# Patient Record
Sex: Female | Born: 1980 | Race: Black or African American | Hispanic: No | Marital: Single | Smoking: Current every day smoker
Health system: Southern US, Community
[De-identification: ages and names within clinical notes are randomized; demographics above are authoritative.]

## PROBLEM LIST (undated history)

## (undated) DIAGNOSIS — M549 Dorsalgia, unspecified: Secondary | ICD-10-CM

## (undated) HISTORY — PX: NO PAST SURGERIES: SHX2092

---

## 2008-03-12 ENCOUNTER — Emergency Department (HOSPITAL_BASED_OUTPATIENT_CLINIC_OR_DEPARTMENT_OTHER): Admission: EM | Admit: 2008-03-12 | Discharge: 2008-03-12 | Payer: Self-pay | Admitting: Emergency Medicine

## 2008-03-20 ENCOUNTER — Emergency Department (HOSPITAL_BASED_OUTPATIENT_CLINIC_OR_DEPARTMENT_OTHER): Admission: EM | Admit: 2008-03-20 | Discharge: 2008-03-20 | Payer: Self-pay | Admitting: Emergency Medicine

## 2008-07-08 ENCOUNTER — Emergency Department (HOSPITAL_BASED_OUTPATIENT_CLINIC_OR_DEPARTMENT_OTHER): Admission: EM | Admit: 2008-07-08 | Discharge: 2008-07-08 | Payer: Self-pay | Admitting: Emergency Medicine

## 2009-04-30 ENCOUNTER — Emergency Department (HOSPITAL_BASED_OUTPATIENT_CLINIC_OR_DEPARTMENT_OTHER): Admission: EM | Admit: 2009-04-30 | Discharge: 2009-04-30 | Payer: Self-pay | Admitting: Emergency Medicine

## 2009-11-16 ENCOUNTER — Emergency Department (HOSPITAL_BASED_OUTPATIENT_CLINIC_OR_DEPARTMENT_OTHER): Admission: EM | Admit: 2009-11-16 | Discharge: 2009-11-16 | Payer: Self-pay | Admitting: Emergency Medicine

## 2009-12-10 ENCOUNTER — Emergency Department (HOSPITAL_BASED_OUTPATIENT_CLINIC_OR_DEPARTMENT_OTHER): Admission: EM | Admit: 2009-12-10 | Discharge: 2009-12-10 | Payer: Self-pay | Admitting: Emergency Medicine

## 2010-01-08 ENCOUNTER — Emergency Department (HOSPITAL_BASED_OUTPATIENT_CLINIC_OR_DEPARTMENT_OTHER): Admission: EM | Admit: 2010-01-08 | Discharge: 2010-01-08 | Payer: Self-pay | Admitting: Emergency Medicine

## 2010-01-08 ENCOUNTER — Ambulatory Visit: Payer: Self-pay | Admitting: Radiology

## 2010-04-02 ENCOUNTER — Emergency Department (HOSPITAL_BASED_OUTPATIENT_CLINIC_OR_DEPARTMENT_OTHER): Admission: EM | Admit: 2010-04-02 | Discharge: 2010-04-03 | Payer: Self-pay | Admitting: Emergency Medicine

## 2010-06-26 ENCOUNTER — Emergency Department (HOSPITAL_BASED_OUTPATIENT_CLINIC_OR_DEPARTMENT_OTHER): Admission: EM | Admit: 2010-06-26 | Discharge: 2010-06-26 | Payer: Self-pay | Admitting: Emergency Medicine

## 2010-10-31 ENCOUNTER — Emergency Department (HOSPITAL_BASED_OUTPATIENT_CLINIC_OR_DEPARTMENT_OTHER)
Admission: EM | Admit: 2010-10-31 | Discharge: 2010-10-31 | Disposition: A | Discharge status: Home / Self Care | Payer: Medicaid Other | Attending: Emergency Medicine | Admitting: Emergency Medicine

## 2010-10-31 DIAGNOSIS — M545 Low back pain, unspecified: Secondary | ICD-10-CM | POA: Insufficient documentation

## 2010-10-31 DIAGNOSIS — G8929 Other chronic pain: Secondary | ICD-10-CM | POA: Insufficient documentation

## 2010-12-04 ENCOUNTER — Inpatient Hospital Stay (HOSPITAL_COMMUNITY): Payer: Medicaid Other

## 2010-12-04 ENCOUNTER — Inpatient Hospital Stay (HOSPITAL_COMMUNITY)
Admission: AD | Admit: 2010-12-04 | Discharge: 2010-12-04 | Disposition: A | Discharge status: Home / Self Care | Payer: Medicaid Other | Source: Ambulatory Visit | Attending: Obstetrics & Gynecology | Admitting: Obstetrics & Gynecology

## 2010-12-04 DIAGNOSIS — R109 Unspecified abdominal pain: Secondary | ICD-10-CM | POA: Insufficient documentation

## 2010-12-04 DIAGNOSIS — O34599 Maternal care for other abnormalities of gravid uterus, unspecified trimester: Secondary | ICD-10-CM | POA: Insufficient documentation

## 2010-12-04 DIAGNOSIS — A499 Bacterial infection, unspecified: Secondary | ICD-10-CM | POA: Insufficient documentation

## 2010-12-04 DIAGNOSIS — N760 Acute vaginitis: Secondary | ICD-10-CM | POA: Insufficient documentation

## 2010-12-04 DIAGNOSIS — O2390 Unspecified genitourinary tract infection in pregnancy, unspecified trimester: Secondary | ICD-10-CM | POA: Insufficient documentation

## 2010-12-04 DIAGNOSIS — B9689 Other specified bacterial agents as the cause of diseases classified elsewhere: Secondary | ICD-10-CM | POA: Insufficient documentation

## 2010-12-04 DIAGNOSIS — N83209 Unspecified ovarian cyst, unspecified side: Secondary | ICD-10-CM | POA: Insufficient documentation

## 2010-12-04 LAB — CBC
HCT: 39.2 % (ref 36.0–46.0)
Hemoglobin: 13.5 g/dL (ref 12.0–15.0)
MCV: 82 fL (ref 78.0–100.0)
WBC: 6.8 10*3/uL (ref 4.0–10.5)

## 2010-12-04 LAB — URINALYSIS, ROUTINE W REFLEX MICROSCOPIC
Ketones, ur: 15 mg/dL — AB
Nitrite: NEGATIVE
Specific Gravity, Urine: >1.03 — ABNORMAL HIGH (ref 1.005–1.030)
pH: 6 (ref 5.0–8.0)

## 2010-12-04 LAB — WET PREP, GENITAL: Trich, Wet Prep: NONE SEEN

## 2010-12-04 LAB — HCG, QUANTITATIVE, PREGNANCY: hCG, Beta Chain, Quant, S: 1529 m[IU]/mL — ABNORMAL HIGH (ref <5)

## 2010-12-04 LAB — POCT PREGNANCY, URINE: Preg Test, Ur: POSITIVE

## 2011-01-25 LAB — CBC
HCT: 36 % (ref 36–46)
Hemoglobin: 12.3 g/dL (ref 12.0–16.0)

## 2011-01-25 LAB — HIV ANTIBODY (ROUTINE TESTING W REFLEX)
HIV: NONREACTIVE
HIV: NONREACTIVE

## 2011-01-25 LAB — RUBELLA ANTIBODY, IGM: Rubella: IMMUNE

## 2011-01-25 LAB — SICKLE CELL SCREEN: Sickle Cell Screen: NEGATIVE

## 2011-01-25 LAB — HEPATITIS B SURFACE ANTIGEN: Hepatitis B Surface Ag: NEGATIVE

## 2011-05-18 LAB — CBC: Hemoglobin: 11.2 g/dL — AB (ref 12.0–16.0)

## 2011-05-31 LAB — URINALYSIS, ROUTINE W REFLEX MICROSCOPIC
Bilirubin Urine: NEGATIVE
Glucose, UA: NEGATIVE
Hgb urine dipstick: NEGATIVE
Specific Gravity, Urine: 1.027
Urobilinogen, UA: 0.2
pH: 6

## 2011-05-31 LAB — PREGNANCY, URINE: Preg Test, Ur: NEGATIVE

## 2011-07-09 LAB — STREP B DNA PROBE: GBS: POSITIVE

## 2011-07-21 ENCOUNTER — Encounter (HOSPITAL_COMMUNITY): Payer: Self-pay | Admitting: *Deleted

## 2011-07-21 ENCOUNTER — Inpatient Hospital Stay (HOSPITAL_COMMUNITY)
Admission: AD | Admit: 2011-07-21 | Discharge: 2011-07-21 | Disposition: A | Discharge status: Home / Self Care | Payer: Medicaid Other | Source: Ambulatory Visit | Attending: Obstetrics and Gynecology | Admitting: Obstetrics and Gynecology

## 2011-07-21 DIAGNOSIS — O471 False labor at or after 37 completed weeks of gestation: Secondary | ICD-10-CM

## 2011-07-21 DIAGNOSIS — O479 False labor, unspecified: Secondary | ICD-10-CM | POA: Insufficient documentation

## 2011-07-21 MED ORDER — OXYCODONE-ACETAMINOPHEN 5-325 MG PO TABS
2.0000 | ORAL_TABLET | Freq: Once | ORAL | Status: AC
  Administered 2011-07-21: 2 via ORAL
  Filled 2011-07-21 (×2): qty 2

## 2011-07-21 MED ORDER — ZOLPIDEM TARTRATE 10 MG PO TABS
10.0000 mg | ORAL_TABLET | Freq: Once | ORAL | Status: AC
  Administered 2011-07-21: 10 mg via ORAL
  Filled 2011-07-21: qty 1

## 2011-07-21 NOTE — ED Provider Notes (Signed)
History     Chief Complaint  Patient presents with  . Contractions   HPI Returned from walking.  Reports contractions have increased in intensity but doesn't think they're any closer.  Denies ROM or bldg.  Has felt baby move.    ROS Unchanged Physical Exam   Blood pressure 139/70, pulse 84, temperature 98 F (36.7 C), temperature source Oral, resp. rate 20, height 5\' 5"  (1.651 m), weight 100.699 kg (222 lb), last menstrual period 10/30/2010.  Physical Exam SVE: Dilation 3cm Effacement: 80% Station: -2 Posterior/soft Vertex  FHR baseline 135 bpm Moderate variability Accels present.  No decels present.  FHR Cat 1 UCs every 6-7 mins and mod to palpation.  MAU Course  Procedures   Assessment and Plan  IUP at 37w 4d Uterine contractions.  Will continue to observe for another hour and re-eval SVE due to pt discomfort with contractions.    Kassondra Geil O. 07/21/2011, 6:14 PM   S:  Pt reports no significant change in contractions at present  O:  VSS.       FHR baseline 125 with moderate variability present.  Accels present.  No decels present.       UCs every 6-9 minutes       SVE per RN unchanged  A:  IUP at 37w 4d      False labor  P:  Discharge home.      Per consult with Dr. Normand Sloop, will give Ambien 10mg  and Percocet 5/325mg  x 2 tabs for therapeutic rest prior to discharge.      Rev'd s/s labor.      RTO at [facility] as previously scheduled for ROB.

## 2011-07-21 NOTE — Progress Notes (Signed)
Pt states she is feeling about the same.Pt introduced to me as her new nurse.

## 2011-07-21 NOTE — ED Provider Notes (Signed)
History     Chief Complaint  Patient presents with  . Contractions   HPI Pt presents with complaint of regular uterine contractions which began today at approximately 11:00 am and have increased in frequency and intensity.  She denies ROM or bldg.  She reports her fetus has been moving normally.  Pt GBS positive.  OB History    Grav Para Term Preterm Abortions TAB SAB Ect Mult Living   5 3 2 1 1 1    2       Past Medical History  Diagnosis Date  . Depression     Past Surgical History  Procedure Date  . No past surgeries     Family History  Problem Relation Age of Onset  . Hypertension Mother   . COPD Mother   . Hypertension Maternal Grandmother   . Cancer Maternal Grandmother     History  Substance Use Topics  . Smoking status: Current Everyday Smoker -- 0.5 packs/day    Types: Cigarettes  . Smokeless tobacco: Never Used   Comment: 1.5 ppd prior to pregnancy  . Alcohol Use: No    Allergies: No Known Allergies  No prescriptions prior to admission    Review of Systems  Constitutional: Negative.   HENT: Negative.   Eyes: Negative.   Respiratory: Negative.   Cardiovascular: Negative.   Gastrointestinal: Negative.   Genitourinary: Negative.   Musculoskeletal: Negative.   Skin: Negative.   Neurological: Negative.   Endo/Heme/Allergies: Negative.   Psychiatric/Behavioral: Negative.    Physical Exam   Blood pressure 139/70, pulse 84, temperature 98 F (36.7 C), temperature source Oral, resp. rate 20, height 5\' 5"  (1.651 m), weight 100.699 kg (222 lb), last menstrual period 10/30/2010.  Physical Exam  Constitutional: She is oriented to person, place, and time. She appears well-developed and well-nourished.  HENT:  Head: Normocephalic and atraumatic.  Right Ear: External ear normal.  Left Ear: External ear normal.  Nose: Nose normal.  Eyes: Conjunctivae are normal. Pupils are equal, round, and reactive to light.  Neck: Normal range of motion. Neck  supple. No thyromegaly present.  Cardiovascular: Normal rate, regular rhythm, normal heart sounds and intact distal pulses.   Respiratory: Effort normal and breath sounds normal.  GI: Soft. Bowel sounds are normal.  Genitourinary: Vagina normal and uterus normal.       Fundal height 38cms.  Vertex to Leopolds maneuver.  EFW 7#.    Musculoskeletal: Normal range of motion.  Neurological: She is alert and oriented to person, place, and time. She has normal reflexes.  Skin: Skin is warm and dry.  Psychiatric: She has a normal mood and affect. Her behavior is normal. Judgment and thought content normal.   FHR baseline 125 bpm.  Moderate variability present.  Accels present.  No decels present. UC's every 2-3 minutes and mod to palpation  SVE:  Dilation 3cm Effacement:  50% Station:  -2 Presentation:  Vtx Soft/posterior  MAU Course  Procedures   Assessment and Plan  IUP at 37w 4d Uterine contractions  Pt to ambulate x 1hr and then re-eval SVE.    Shilo Philipson O. 07/21/2011, 5:11 PM

## 2011-07-23 ENCOUNTER — Inpatient Hospital Stay (HOSPITAL_COMMUNITY): Payer: Medicaid Other | Admitting: Anesthesiology

## 2011-07-23 ENCOUNTER — Encounter (HOSPITAL_COMMUNITY): Payer: Self-pay | Admitting: Anesthesiology

## 2011-07-23 ENCOUNTER — Inpatient Hospital Stay (HOSPITAL_COMMUNITY)
Admission: AD | Admit: 2011-07-23 | Discharge: 2011-07-24 | DRG: 775 | Disposition: A | Discharge status: Home / Self Care | Payer: Medicaid Other | Source: Ambulatory Visit | Attending: Obstetrics and Gynecology | Admitting: Obstetrics and Gynecology

## 2011-07-23 ENCOUNTER — Encounter (HOSPITAL_COMMUNITY): Payer: Self-pay

## 2011-07-23 DIAGNOSIS — O4290 Premature rupture of membranes, unspecified as to length of time between rupture and onset of labor, unspecified weeks of gestation: Principal | ICD-10-CM | POA: Diagnosis present

## 2011-07-23 DIAGNOSIS — Z8619 Personal history of other infectious and parasitic diseases: Secondary | ICD-10-CM | POA: Diagnosis not present

## 2011-07-23 DIAGNOSIS — O99892 Other specified diseases and conditions complicating childbirth: Secondary | ICD-10-CM | POA: Diagnosis present

## 2011-07-23 DIAGNOSIS — Z8659 Personal history of other mental and behavioral disorders: Secondary | ICD-10-CM

## 2011-07-23 DIAGNOSIS — O26851 Spotting complicating pregnancy, first trimester: Secondary | ICD-10-CM | POA: Diagnosis not present

## 2011-07-23 DIAGNOSIS — Z22330 Carrier of Group B streptococcus: Secondary | ICD-10-CM

## 2011-07-23 DIAGNOSIS — F17200 Nicotine dependence, unspecified, uncomplicated: Secondary | ICD-10-CM | POA: Diagnosis present

## 2011-07-23 LAB — CBC
Platelets: 234 10*3/uL (ref 150–400)
RBC: 3.8 MIL/uL — ABNORMAL LOW (ref 3.87–5.11)
WBC: 11.7 10*3/uL — ABNORMAL HIGH (ref 4.0–10.5)

## 2011-07-23 LAB — RPR: RPR Ser Ql: NONREACTIVE

## 2011-07-23 MED ORDER — EPHEDRINE 5 MG/ML INJ: 10.0000 mg | INTRAVENOUS | Status: DC | PRN

## 2011-07-23 MED ORDER — OXYTOCIN 10 UNIT/ML IJ SOLN: 10.0000 [IU] | Freq: Once | INTRAMUSCULAR | Status: DC

## 2011-07-23 MED ORDER — TETANUS-DIPHTH-ACELL PERTUSSIS 5-2.5-18.5 LF-MCG/0.5 IM SUSP
0.5000 mL | Freq: Once | INTRAMUSCULAR | Status: AC
  Administered 2011-07-24: 0.5 mL via INTRAMUSCULAR
  Filled 2011-07-23: qty 0.5

## 2011-07-23 MED ORDER — LANOLIN HYDROUS EX OINT: TOPICAL_OINTMENT | CUTANEOUS | Status: DC | PRN

## 2011-07-23 MED ORDER — TERBUTALINE SULFATE 1 MG/ML IJ SOLN: 0.2500 mg | Freq: Once | INTRAMUSCULAR | Status: DC | PRN

## 2011-07-23 MED ORDER — MEDROXYPROGESTERONE ACETATE 150 MG/ML IM SUSP: 150.0000 mg | INTRAMUSCULAR | Status: DC | PRN

## 2011-07-23 MED ORDER — ONDANSETRON HCL 4 MG/2ML IJ SOLN: 4.0000 mg | Freq: Four times a day (QID) | INTRAMUSCULAR | Status: DC | PRN

## 2011-07-23 MED ORDER — OXYTOCIN 20 UNITS IN LACTATED RINGERS INFUSION - SIMPLE: 125.0000 mL/h | Freq: Once | INTRAVENOUS | Status: DC

## 2011-07-23 MED ORDER — DIPHENHYDRAMINE HCL 25 MG PO CAPS: 25.0000 mg | ORAL_CAPSULE | Freq: Four times a day (QID) | ORAL | Status: DC | PRN

## 2011-07-23 MED ORDER — OXYTOCIN 20 UNITS IN LACTATED RINGERS INFUSION - SIMPLE
1.0000 m[IU]/min | INTRAVENOUS | Status: DC
  Administered 2011-07-23: 1 m[IU]/min via INTRAVENOUS
  Filled 2011-07-23: qty 1000

## 2011-07-23 MED ORDER — SENNOSIDES-DOCUSATE SODIUM 8.6-50 MG PO TABS
2.0000 | ORAL_TABLET | Freq: Every day | ORAL | Status: DC
  Administered 2011-07-23: 2 via ORAL

## 2011-07-23 MED ORDER — LIDOCAINE HCL (PF) 1 % IJ SOLN
30.0000 mL | INTRAMUSCULAR | Status: DC | PRN
  Filled 2011-07-23: qty 30

## 2011-07-23 MED ORDER — PHENYLEPHRINE 40 MCG/ML (10ML) SYRINGE FOR IV PUSH (FOR BLOOD PRESSURE SUPPORT): 80.0000 ug | PREFILLED_SYRINGE | INTRAVENOUS | Status: DC | PRN

## 2011-07-23 MED ORDER — LACTATED RINGERS IV SOLN: 500.0000 mL | INTRAVENOUS | Status: DC | PRN

## 2011-07-23 MED ORDER — PENICILLIN G POTASSIUM 5000000 UNITS IJ SOLR
2.5000 10*6.[IU] | INTRAVENOUS | Status: DC
  Administered 2011-07-23: 2.5 10*6.[IU] via INTRAVENOUS
  Filled 2011-07-23 (×3): qty 2.5

## 2011-07-23 MED ORDER — IBUPROFEN 600 MG PO TABS
600.0000 mg | ORAL_TABLET | Freq: Four times a day (QID) | ORAL | Status: DC
  Administered 2011-07-23 – 2011-07-24 (×5): 600 mg via ORAL
  Filled 2011-07-23 (×4): qty 1

## 2011-07-23 MED ORDER — FENTANYL 2.5 MCG/ML BUPIVACAINE 1/10 % EPIDURAL INFUSION (WH - ANES)
14.0000 mL/h | INTRAMUSCULAR | Status: DC
  Administered 2011-07-23 (×2): 14 mL/h via EPIDURAL
  Filled 2011-07-23 (×2): qty 60

## 2011-07-23 MED ORDER — ACETAMINOPHEN 325 MG PO TABS: 650.0000 mg | ORAL_TABLET | ORAL | Status: DC | PRN

## 2011-07-23 MED ORDER — EPHEDRINE 5 MG/ML INJ
10.0000 mg | INTRAVENOUS | Status: DC | PRN
  Filled 2011-07-23: qty 4

## 2011-07-23 MED ORDER — OXYCODONE-ACETAMINOPHEN 5-325 MG PO TABS
1.0000 | ORAL_TABLET | ORAL | Status: DC | PRN
  Administered 2011-07-23 (×2): 2 via ORAL
  Administered 2011-07-24 (×3): 1 via ORAL
  Filled 2011-07-23 (×2): qty 1
  Filled 2011-07-23: qty 2
  Filled 2011-07-23: qty 1
  Filled 2011-07-23: qty 2

## 2011-07-23 MED ORDER — PRENATAL PLUS 27-1 MG PO TABS
1.0000 | ORAL_TABLET | Freq: Every day | ORAL | Status: DC
  Administered 2011-07-23 – 2011-07-24 (×2): 1 via ORAL
  Filled 2011-07-23: qty 1

## 2011-07-23 MED ORDER — SIMETHICONE 80 MG PO CHEW: 80.0000 mg | CHEWABLE_TABLET | ORAL | Status: DC | PRN

## 2011-07-23 MED ORDER — WITCH HAZEL-GLYCERIN EX PADS: 1.0000 "application " | MEDICATED_PAD | CUTANEOUS | Status: DC | PRN

## 2011-07-23 MED ORDER — IBUPROFEN 600 MG PO TABS
600.0000 mg | ORAL_TABLET | Freq: Four times a day (QID) | ORAL | Status: DC | PRN
  Administered 2011-07-23: 600 mg via ORAL
  Filled 2011-07-23 (×2): qty 1

## 2011-07-23 MED ORDER — OXYTOCIN BOLUS FROM INFUSION
500.0000 mL | Freq: Once | INTRAVENOUS | Status: DC
  Filled 2011-07-23: qty 500

## 2011-07-23 MED ORDER — CITRIC ACID-SODIUM CITRATE 334-500 MG/5ML PO SOLN
30.0000 mL | ORAL | Status: DC | PRN
  Administered 2011-07-23: 30 mL via ORAL
  Filled 2011-07-23: qty 15

## 2011-07-23 MED ORDER — DIBUCAINE 1 % RE OINT: 1.0000 "application " | TOPICAL_OINTMENT | RECTAL | Status: DC | PRN

## 2011-07-23 MED ORDER — LACTATED RINGERS IV SOLN: 500.0000 mL | Freq: Once | INTRAVENOUS | Status: DC

## 2011-07-23 MED ORDER — ZOLPIDEM TARTRATE 5 MG PO TABS: 5.0000 mg | ORAL_TABLET | Freq: Every evening | ORAL | Status: DC | PRN

## 2011-07-23 MED ORDER — ONDANSETRON HCL 4 MG/2ML IJ SOLN: 4.0000 mg | INTRAMUSCULAR | Status: DC | PRN

## 2011-07-23 MED ORDER — ONDANSETRON HCL 4 MG PO TABS: 4.0000 mg | ORAL_TABLET | ORAL | Status: DC | PRN

## 2011-07-23 MED ORDER — MAGNESIUM HYDROXIDE 400 MG/5ML PO SUSP: 30.0000 mL | ORAL | Status: DC | PRN

## 2011-07-23 MED ORDER — BENZOCAINE-MENTHOL 20-0.5 % EX AERO: 1.0000 "application " | INHALATION_SPRAY | CUTANEOUS | Status: DC | PRN

## 2011-07-23 MED ORDER — OXYCODONE-ACETAMINOPHEN 5-325 MG PO TABS: 2.0000 | ORAL_TABLET | ORAL | Status: DC | PRN

## 2011-07-23 MED ORDER — DIPHENHYDRAMINE HCL 50 MG/ML IJ SOLN: 12.5000 mg | INTRAMUSCULAR | Status: DC | PRN

## 2011-07-23 MED ORDER — DEXTROSE 5 % IV SOLN
5.0000 10*6.[IU] | Freq: Once | INTRAVENOUS | Status: AC
  Administered 2011-07-23: 5 10*6.[IU] via INTRAVENOUS
  Filled 2011-07-23: qty 5

## 2011-07-23 MED ORDER — PHENYLEPHRINE 40 MCG/ML (10ML) SYRINGE FOR IV PUSH (FOR BLOOD PRESSURE SUPPORT)
80.0000 ug | PREFILLED_SYRINGE | INTRAVENOUS | Status: DC | PRN
  Filled 2011-07-23: qty 5

## 2011-07-23 MED ORDER — LIDOCAINE HCL 1.5 % IJ SOLN
INTRAMUSCULAR | Status: DC | PRN
  Administered 2011-07-23 (×2): 5 mL via EPIDURAL

## 2011-07-23 MED ORDER — LACTATED RINGERS IV SOLN
INTRAVENOUS | Status: DC
Start: 2011-07-23 — End: 2011-07-23
  Administered 2011-07-23: 04:00:00 via INTRAVENOUS

## 2011-07-23 NOTE — Progress Notes (Signed)
Subjective: Pt received epidural around 0830, but still having discomfort and breathing/grimacing w/ ctxs; pushed PCA at 0910, and will continue to assess response.  Per pt, RN rechecked cervix and 4cm.  Pt is on Pitocin and has received 2 doses of PCN.  S.o., daughter, and another female visitor at [facility].  Objective: BP 124/67  Pulse 87  Temp(Src) 97.7 F (36.5 C) (Oral)  Resp 20  Ht 5' 5.5" (1.664 m)  Wt 101.061 kg (222 lb 12.8 oz)  BMI 36.51 kg/m2  SpO2 99%  LMP 10/30/2010      FHT:  FHR: 125 bpm, variability: moderate,  accelerations:  Present,  decelerations:  Absent UC:   toco not tracing well at this time; pt had one ctx during the 5 min I was at [facility] SVE:   Dilation: 4 Effacement (%): 90 Station: -1 Exam by:: [email or web address]  Labs: Lab Results  Component Value Date   WBC 11.7* 07/23/2011   HGB 10.8* 07/23/2011   HCT 32.4* 07/23/2011   MCV 85.3 07/23/2011   PLT 234 07/23/2011    Assessment / Plan: Augmentation of labor, progressing well  Labor: Active labor on Pitocin Preeclampsia:  no signs or symptoms of toxicity Fetal Wellbeing:  Category I Pain Control:  Epidural I/D:  n/a Anticipated MOD:  NSVD  Rick Carruthers H 07/23/2011, 9:15 AM

## 2011-07-23 NOTE — Progress Notes (Signed)
Pt G% P2 at 37.6wks, leaking clear fluid since 0200.  Denies pain or bleeding at this time.

## 2011-07-23 NOTE — Anesthesia Postprocedure Evaluation (Signed)
  Anesthesia Post-op Note  Patient: Annette Stafford  Procedure(s) Performed: * No procedures listed *  Patient Location: Mother/Baby  Anesthesia Type: Epidural  Level of Consciousness: awake, alert  and oriented  Airway and Oxygen Therapy: Patient Spontanous Breathing  Post-op Pain: none  Post-op Assessment: Post-op Vital signs reviewed, Patient's Cardiovascular Status Stable, No headache and No backache  Post-op Vital Signs: Reviewed and stable  Complications: No apparent anesthesia complications

## 2011-07-23 NOTE — Anesthesia Preprocedure Evaluation (Signed)
Anesthesia Evaluation  Patient identified by MRN, date of birth, ID band Patient awake    Reviewed: Allergy & Precautions, H&P , Patient's Chart, lab work & pertinent test results  Airway Mallampati: III TM Distance: >3 FB Neck ROM: full    Dental No notable dental hx.    Pulmonary neg pulmonary ROS,  clear to auscultation  Pulmonary exam normal       Cardiovascular neg cardio ROS regular Normal    Neuro/Psych Negative Neurological ROS  Negative Psych ROS   GI/Hepatic negative GI ROS, Neg liver ROS,   Endo/Other  Negative Endocrine ROSMorbid obesity  Renal/GU negative Renal ROS     Musculoskeletal   Abdominal   Peds  Hematology negative hematology ROS (+)   Anesthesia Other Findings   Reproductive/Obstetrics (+) Pregnancy                           Anesthesia Physical Anesthesia Plan  ASA: III  Anesthesia Plan: Epidural   Post-op Pain Management:    Induction:   Airway Management Planned:   Additional Equipment:   Intra-op Plan:   Post-operative Plan:   Informed Consent: I have reviewed the patients History and Physical, chart, labs and discussed the procedure including the risks, benefits and alternatives for the proposed anesthesia with the patient or authorized representative who has indicated his/her understanding and acceptance.     Plan Discussed with:   Anesthesia Plan Comments:         Anesthesia Quick Evaluation  

## 2011-07-23 NOTE — Progress Notes (Signed)
Delivery Note Pt complete around 1030.  Pushed well to SVD at 10:51 AM a viable female "Annette Stafford" was delivered via Vaginal, Spontaneous Delivery (Presentation: Right Occiput Anterior).  APGAR: 9, 9; weight 6 lb 1.2 oz (2756 g).   Placenta status: Intact, Spontaneous.  Cord: 3 vessels with the following complications: Short.  Cord pH: n/a. Cord blood collected for hospital labs and donation.  Fundus firm after placenta delivered below umbilicus.  Anesthesia: Epidural  Episiotomy: None Lacerations: None Suture Repair: n/a Est. Blood Loss (mL): .Marland Kitchen Filed Vitals:   07/23/11 1202 07/23/11 1230 07/23/11 1329 07/23/11 1700  BP: 109/59 110/70 107/68 112/78  Pulse: 82 88 81 98  Temp:  97.9 F (36.6 C) 98.2 F (36.8 C) 98.4 F (36.9 C)  TempSrc:  Oral Oral Oral  Resp:  18 18 18   Height:      Weight:      SpO2:  99% 99% 99%   Mom to postpartum.  Baby to nursery-stable.  All bonding well.  Declines BTL.  Jing Howatt H 07/23/2011, 5:22 PM

## 2011-07-23 NOTE — Anesthesia Procedure Notes (Signed)
Epidural Patient location during procedure: OB Start time: 07/23/2011 8:26 AM  Staffing Performed by: anesthesiologist   Preanesthetic Checklist Completed: patient identified, site marked, surgical consent, pre-op evaluation, timeout performed, IV checked, risks and benefits discussed and monitors and equipment checked  Epidural Patient position: sitting Prep: site prepped and draped and DuraPrep Patient monitoring: continuous pulse ox and blood pressure Approach: midline Injection technique: LOR air and LOR saline  Needle:  Needle type: Tuohy  Needle gauge: 17 G Needle length: 9 cm Needle insertion depth: 8 cm Catheter type: closed end flexible Catheter size: 19 Gauge Catheter at skin depth: 13 cm Test dose: negative  Assessment Events: blood not aspirated, injection not painful, no injection resistance, negative IV test and no paresthesia  Additional Notes Patient identified.  Risk benefits discussed including failed block, incomplete pain control, headache, nerve damage, paralysis, blood pressure changes, nausea, vomiting, reactions to medication both toxic or allergic, and postpartum back pain.  Patient expressed understanding and wished to proceed.  All questions were answered.  Sterile technique used throughout procedure and epidural site dressed with sterile barrier dressing. No paresthesia or other complications noted.The patient did not experience any signs of intravascular injection such as tinnitus or metallic taste in mouth nor signs of intrathecal spread such as rapid motor block. Please see nursing notes for vital signs.

## 2011-07-23 NOTE — H&P (Signed)
Annette Stafford is a 30 y.o. female presenting for SROM at 0200 with clear fluid, reports rare ctx, no pain, +FM, no VB.  Pregnancy significant for: 1. +GBS 2. Hx 22wk loss 3. Hx PIH - no meds 4. Smoker 5. abnl pap - LGSIL, +HPV  Maternal Medical History:  Reason for admission: Reason for admission: rupture of membranes.  Contractions: Frequency: rare.    Fetal activity: Perceived fetal activity is normal.   Last perceived fetal movement was within the past hour.    Prenatal complications: no prenatal complications   OB History    Grav Para Term Preterm Abortions TAB SAB Ect Mult Living   5 3 2 1 1 1    2      Past Medical History  Diagnosis Date  . Depression    Past Surgical History  Procedure Date  . No past surgeries    Family History: family history includes COPD in her mother; Cancer in her maternal grandmother; and Hypertension in her maternal grandmother and mother. Social History:  reports that she has been smoking Cigarettes.  She has been smoking about .5 packs per day. She has never used smokeless tobacco. She reports that she does not drink alcohol or use illicit drugs. Pt is Single AA, FOB involved, has 64yrs of education, currently a student  Review of Systems  All other systems reviewed and are negative.    Dilation: 3 Effacement (%): 80 Station: -2 Exam by:: note previous exam by S.Shannell Mikkelsen,CNM Blood pressure 137/71, pulse 101, temperature 98.1 F (36.7 C), temperature source Oral, resp. rate 18, height 5' 5.5" (1.664 m), weight 101.061 kg (222 lb 12.8 oz), last menstrual period 10/30/2010. Maternal Exam:  Uterine Assessment: Contraction strength is mild.  Contraction frequency is irregular.   Abdomen: Patient reports no abdominal tenderness. Estimated fetal weight is 7#.   Fetal presentation: vertex  Introitus: Normal vulva. Normal vagina.  Ferning test: not done.  Amniotic fluid character: clear. Grossly ruptured clear fluid  Pelvis: adequate for  delivery.   Cervix: Cervix evaluated by digital exam.     Fetal Exam Fetal Monitor Review: Mode: ultrasound.   Baseline rate: 145.  Variability: moderate (6-25 bpm).   Pattern: accelerations present and no decelerations.    Fetal State Assessment: Category I - tracings are normal.     Physical Exam  Nursing note and vitals reviewed. Constitutional: She is oriented to person, place, and time. She appears well-developed and well-nourished.  Cardiovascular: Normal rate.   Respiratory: Effort normal.  GI: Soft.  Genitourinary: Vagina normal.  Musculoskeletal: Normal range of motion.  Neurological: She is alert and oriented to person, place, and time.  Skin: Skin is warm and dry.  Psychiatric: She has a normal mood and affect. Her behavior is normal.    Prenatal labs: ABO, Rh: --/--/O POS (04/02 1535) Antibody: Negative (05/24 0000) Rubella: Immune (05/24 0000) RPR: Nonreactive (09/14 0000)  HBsAg: Negative (05/24 0000)  HIV: Non-reactive (05/24 0000)  GBS: Positive (11/05 0000)   Assessment/Plan: IUP at [redacted]w[redacted]d PROM +GBS  Admit to birthing suites, routine CNM orders PCN per protocol Pitocin augmentation Epidural PRN    Shandie Bertz M 07/23/2011, 3:48 AM

## 2011-07-24 LAB — CBC
Hemoglobin: 8.9 g/dL — ABNORMAL LOW (ref 12.0–15.0)
MCH: 28 pg (ref 26.0–34.0)
MCHC: 32.5 g/dL (ref 30.0–36.0)
Platelets: 211 10*3/uL (ref 150–400)
RDW: 14 % (ref 11.5–15.5)

## 2011-07-24 MED ORDER — IBUPROFEN 600 MG PO TABS: 600.0000 mg | ORAL_TABLET | Freq: Four times a day (QID) | ORAL | Status: AC | PRN

## 2011-07-24 MED ORDER — OXYCODONE-ACETAMINOPHEN 5-325 MG PO TABS: 1.0000 | ORAL_TABLET | ORAL | Status: AC | PRN

## 2011-07-24 MED ORDER — BENZOCAINE-MENTHOL 20-0.5 % EX AERO
INHALATION_SPRAY | CUTANEOUS | Status: AC
  Filled 2011-07-24: qty 56

## 2011-07-24 MED ORDER — POLYSACCHARIDE IRON 150 MG PO CAPS
150.0000 mg | ORAL_CAPSULE | Freq: Two times a day (BID) | ORAL | Status: DC
  Administered 2011-07-24: 150 mg via ORAL
  Filled 2011-07-24 (×2): qty 1

## 2011-07-24 NOTE — Progress Notes (Signed)
UR Chart review completed.  

## 2011-07-24 NOTE — Discharge Summary (Signed)
Obstetric Discharge Summary Reason for Admission: rupture of membranes Prenatal Procedures: ultrasound Intrapartum Procedures: spontaneous vaginal delivery, GBS prophylaxis and Pitocin augmentation, epidural Postpartum Procedures: none Complications-Operative and Postpartum: none Hemoglobin  Date Value Range Status  07/24/2011 8.9* 12.0-15.0 (g/dL) Final     DELTA CHECK NOTED     REPEATED TO VERIFY     HCT  Date Value Range Status  07/24/2011 27.4* 36.0-46.0 (%) Final    Discharge Diagnoses: Term Pregnancy-delivered and Formula feeding; anemia w/o s/s; smoker; h/o HR HPV and abnl pap; remote h/o depression--no s/s currently;   Discharge Information: Date: 07/24/2011 Activity: pelvic rest Diet: routine and iron-rich Medications: PNV, Ibuprofen, Colace, Iron and Percocet Condition: stable Instructions: refer to practice specific booklet Discharge to: home Follow-up Information    Follow up with [facility]. Make an appointment in 6 weeks. (or follow-up  as needed)          Newborn Data: Live born female "Jamie" (delivery provider=Hilary Secily Walthour, CNM) Birth Weight: 6 lb 1.2 oz (2756 g) APGAR: 9, 9  Home with mother.  Anistyn Graddy H 07/24/2011, 7:55 PM

## 2011-07-24 NOTE — Progress Notes (Signed)
Post Partum Day 1 Subjective: no complaints, up ad lib, voiding, tolerating PO, + flatus and no dizziness.  Reports newborn w/ little formula intake since 9am, but only 1 oz wt loss since birth.  Has had several wet diapers, but only 1 stooled diaper.  Interested in early d/c home today. VB lightening.  Objective: Blood pressure 113/82, pulse 82, temperature 97.7 F (36.5 C), temperature source Oral, resp. rate 18, height 5' 5.5" (1.664 m), weight 101.061 kg (222 lb 12.8 oz), last menstrual period 10/30/2010, SpO2 99.00%, unknown if currently breastfeeding.  Physical Exam:  General: alert, cooperative and no distress Lochia: appropriate Uterine Fundus: below umbilicus Incision: n/a DVT Evaluation: No evidence of DVT seen on physical exam. Negative Homan's sign. No significant calf/ankle edema.   Basename 07/24/11 0528 07/23/11 0413  HGB 8.9* 10.8*  HCT 27.4* 32.4*    Assessment/Plan: PPD #1, s/p SVD; further drop in Hgb, but asymptomatic.  Formula feeding.   Pt agreeable to see how newborn does w/ feeds and output over next few hours, & if peds ok w/ d/c later this evening, will d/c pt then with contingency.   Will start po iron supplement and do orthostatics.   LOS: 1 day   Raeleigh Guinn H 07/24/2011, 1:22 PM

## 2011-07-24 NOTE — Progress Notes (Signed)
Patient was referred for history of depression/anxiety.  * Referral screened out by Clinical Social Worker because none of the following criteria appear to apply:  ~ History of anxiety/depression during this pregnancy, or of post-partum depression.  ~ Diagnosis of anxiety and/or depression within last 3 years  ~ History of depression due to pregnancy loss/loss of child  OR  * Patient's symptoms currently being treated with medication and/or therapy.  Please contact the Clinical Social Worker if needs arise, or by the patient's request.  Pt denies any depression symptoms in over 6 years.

## 2011-07-24 NOTE — Progress Notes (Signed)
S: Pt requesting early discharge.  Peds has given order to d/c newborn and they have follow-up for her tomorrow for weight check.  She has fed well this afternoon and good output.  Pt is undecided on [facility] and although she is not older than 36, she is a smoker; she previously had IUD that migrated, although I do not know complete history of this or details.  Do dizziness or syncope this afternoon.  VB has continued to taper.  Orthostatic VS were WNL.  Pt has necessitated percocet for pain in addition to motrin for lower abdominal cramping, and requests Rx at d/c tonight. Pt reports large BM today. O:  .Marland Kitchen Filed Vitals:   07/24/11 1440 07/24/11 1445 07/24/11 1450 07/24/11 1800  BP: 112/70 110/75 115/77 118/63  Pulse: 80 77 76 90  Temp: 97.2 F (36.2 C)   98.1 F (36.7 C)  TempSrc: Oral   Oral  Resp: 18 18 18 18   Height:      Weight:      SpO2:    96%  PE remain unchanged from AM rounds and WNL  A:  1.  PPD#1  2.  Desires early d/c home today  3.  Anemia w/o hemodynamic instability  4. Formula-feeding 5. Remote h/o depression w/o s/s and had SW consult  P:  1.  D/c home per c/w dr. Normand Sloop  2.  Instructions per [facility] pamphlet   3.  Rx for Integra Plus (rec'd Floridex however), Motrin, and Percocet, & rec'd Colace.  4.  Rec'd abstinence until contraception decided on, but after discussion of options, pt interested in Nexplanon  5.  Breast care rev'd  6.  F/u 6 weeks at [facility] or prn

## 2011-10-01 ENCOUNTER — Emergency Department (HOSPITAL_BASED_OUTPATIENT_CLINIC_OR_DEPARTMENT_OTHER)
Admission: EM | Admit: 2011-10-01 | Discharge: 2011-10-01 | Disposition: A | Discharge status: Home / Self Care | Payer: Medicaid Other | Attending: Emergency Medicine | Admitting: Emergency Medicine

## 2011-10-01 ENCOUNTER — Encounter (HOSPITAL_BASED_OUTPATIENT_CLINIC_OR_DEPARTMENT_OTHER): Payer: Self-pay

## 2011-10-01 DIAGNOSIS — F17200 Nicotine dependence, unspecified, uncomplicated: Secondary | ICD-10-CM | POA: Insufficient documentation

## 2011-10-01 DIAGNOSIS — M549 Dorsalgia, unspecified: Secondary | ICD-10-CM | POA: Insufficient documentation

## 2011-10-01 DIAGNOSIS — R209 Unspecified disturbances of skin sensation: Secondary | ICD-10-CM | POA: Insufficient documentation

## 2011-10-01 HISTORY — DX: Dorsalgia, unspecified: M54.9

## 2011-10-01 MED ORDER — METHOCARBAMOL 500 MG PO TABS: 500.0000 mg | ORAL_TABLET | Freq: Two times a day (BID) | ORAL | Status: DC

## 2011-10-01 MED ORDER — METHOCARBAMOL 500 MG PO TABS: 500.0000 mg | ORAL_TABLET | Freq: Two times a day (BID) | ORAL | Status: AC

## 2011-10-01 NOTE — ED Notes (Signed)
Hx of chronic back pain-fell 2 days ago-c/o pain to lower back

## 2011-10-01 NOTE — ED Provider Notes (Signed)
History     CSN: [id number]  Arrival date & time 10/01/11  1119   First MD Initiated Contact with Patient 10/01/11 1208      Chief Complaint  Patient presents with  . Back Pain    (Consider location/radiation/quality/duration/timing/severity/associated sxs/prior treatment) Patient is a 31 y.o. female presenting with back pain. The history is provided by the patient. No language interpreter was used.  Back Pain  This is a new problem. The current episode started 2 days ago. The problem occurs constantly. The problem has been gradually worsening. The pain is associated with no known injury. The pain is present in the lumbar spine. The quality of the pain is described as aching. The pain is at a severity of 6/10. The pain is moderate. The pain is the same all the time. Stiffness is present all day. She has tried nothing for the symptoms. The treatment provided moderate relief.  Pt complains of falling 2 days ago and injuring back  Past Medical History  Diagnosis Date  . Back pain     Past Surgical History  Procedure Date  . No past surgeries     Family History  Problem Relation Age of Onset  . Hypertension Mother   . COPD Mother   . Hypertension Maternal Grandmother   . Cancer Maternal Grandmother     History  Substance Use Topics  . Smoking status: Current Everyday Smoker -- 0.5 packs/day    Types: Cigarettes  . Smokeless tobacco: Never Used   Comment: 1.5 ppd prior to pregnancy  . Alcohol Use: No    OB History    Grav Para Term Preterm Abortions TAB SAB Ect Mult Living   5 4 3 1 1 1    3       Review of Systems  Musculoskeletal: Positive for back pain.  All other systems reviewed and are negative.    Allergies  Review of patient's allergies indicates no known allergies.  Home Medications   Current Outpatient Rx  Name Route Sig Dispense Refill  . ACETAMINOPHEN 325 MG PO TABS Oral Take 650 mg by mouth every 6 (six) hours as needed.    Marland Kitchen FLEXERIL PO Oral  Take by mouth.    Marland Kitchen NAPROXEN SODIUM 220 MG PO TABS Oral Take 220 mg by mouth 2 (two) times daily with a meal.      BP 119/53  Pulse 70  Temp(Src) 97.9 F (36.6 C) (Oral)  Resp 16  Ht 5\' 5"  (1.651 m)  Wt 215 lb (97.523 kg)  BMI 35.78 kg/m2  SpO2 100%  LMP 08/29/2011  Breastfeeding? No  Physical Exam  Vitals reviewed. Constitutional: She appears well-developed and well-nourished.  HENT:  Head: Normocephalic.  Right Ear: External ear normal.  Eyes: Pupils are equal, round, and reactive to light.  Neck: Normal range of motion.  Cardiovascular: Normal rate and normal heart sounds.   Pulmonary/Chest: Effort normal.  Musculoskeletal: She exhibits tenderness.       Diffusely tender low back  Neurological: She is alert.  Skin: Skin is warm.  Psychiatric: She has a normal mood and affect.    ED Course  Procedures (including critical care time)  Labs Reviewed - No data to display No results found.   No diagnosis found.    MDM  Pt given rx for robaxin,  I advised aleve otc       Langston Masker, [place] 10/01/11 1230

## 2011-10-03 NOTE — ED Provider Notes (Signed)
Evaluation and management procedures were performed by the PA/NP under my supervision/collaboration.   Felisa Bonier, MD 10/03/11 2007

## 2013-01-26 ENCOUNTER — Emergency Department (HOSPITAL_BASED_OUTPATIENT_CLINIC_OR_DEPARTMENT_OTHER)
Admission: EM | Admit: 2013-01-26 | Discharge: 2013-01-26 | Disposition: A | Discharge status: Home / Self Care | Payer: Self-pay | Attending: Emergency Medicine | Admitting: Emergency Medicine

## 2013-01-26 ENCOUNTER — Emergency Department (HOSPITAL_BASED_OUTPATIENT_CLINIC_OR_DEPARTMENT_OTHER): Payer: Self-pay

## 2013-01-26 ENCOUNTER — Encounter (HOSPITAL_BASED_OUTPATIENT_CLINIC_OR_DEPARTMENT_OTHER): Payer: Self-pay

## 2013-01-26 DIAGNOSIS — R51 Headache: Secondary | ICD-10-CM

## 2013-01-26 DIAGNOSIS — R112 Nausea with vomiting, unspecified: Secondary | ICD-10-CM | POA: Insufficient documentation

## 2013-01-26 DIAGNOSIS — R1110 Vomiting, unspecified: Secondary | ICD-10-CM

## 2013-01-26 DIAGNOSIS — F17200 Nicotine dependence, unspecified, uncomplicated: Secondary | ICD-10-CM | POA: Insufficient documentation

## 2013-01-26 LAB — URINALYSIS, ROUTINE W REFLEX MICROSCOPIC
Ketones, ur: NEGATIVE mg/dL
Leukocytes, UA: NEGATIVE
Nitrite: NEGATIVE
Protein, ur: NEGATIVE mg/dL
Urobilinogen, UA: 0.2 mg/dL (ref 0.0–1.0)

## 2013-01-26 LAB — PREGNANCY, URINE: Preg Test, Ur: NEGATIVE

## 2013-01-26 MED ORDER — NAPROXEN 375 MG PO TABS: 375.0000 mg | ORAL_TABLET | Freq: Two times a day (BID) | ORAL | Status: DC

## 2013-01-26 MED ORDER — PROMETHAZINE HCL 25 MG/ML IJ SOLN
12.5000 mg | Freq: Once | INTRAMUSCULAR | Status: AC
  Administered 2013-01-26: 12.5 mg via INTRAMUSCULAR
  Filled 2013-01-26: qty 1

## 2013-01-26 MED ORDER — KETOROLAC TROMETHAMINE 60 MG/2ML IM SOLN
60.0000 mg | Freq: Once | INTRAMUSCULAR | Status: AC
  Administered 2013-01-26: 60 mg via INTRAMUSCULAR
  Filled 2013-01-26: qty 2

## 2013-01-26 MED ORDER — DIPHENHYDRAMINE HCL 25 MG PO CAPS
25.0000 mg | ORAL_CAPSULE | Freq: Once | ORAL | Status: AC
  Administered 2013-01-26: 25 mg via ORAL
  Filled 2013-01-26: qty 1

## 2013-01-26 MED ORDER — METOCLOPRAMIDE HCL 10 MG PO TABS
10.0000 mg | ORAL_TABLET | Freq: Once | ORAL | Status: AC
  Administered 2013-01-26: 10 mg via ORAL
  Filled 2013-01-26: qty 1

## 2013-01-26 MED ORDER — PROMETHAZINE HCL 25 MG RE SUPP: 25.0000 mg | Freq: Four times a day (QID) | RECTAL | Status: DC | PRN

## 2013-01-26 NOTE — ED Provider Notes (Signed)
History     CSN: [id number]  Arrival date & time 01/26/13  0116   First MD Initiated Contact with Patient 01/26/13 0308      Chief Complaint  Patient presents with  . Headache    (Consider location/radiation/quality/duration/timing/severity/associated sxs/prior treatment) Patient is a 32 y.o. female presenting with headaches. The history is provided by the patient.  Headache Pain location:  Frontal Quality:  Dull Radiates to:  Does not radiate Severity currently:  9/10 Severity at highest:  9/10 Onset quality:  Gradual Duration:  2 days Timing:  Constant Progression:  Worsening Chronicity:  Recurrent Context: not activity, not stress and not intercourse   Relieved by:  Nothing Worsened by:  Nothing tried Ineffective treatments:  None tried Associated symptoms: nausea and vomiting   Associated symptoms: no abdominal pain, no back pain, no blurred vision, no congestion, no cough, no diarrhea, no dizziness, no drainage, no ear pain, no pain, no facial pain, no fatigue, no fever, no focal weakness, no near-syncope, no neck pain, no neck stiffness, no numbness, no paresthesias, no photophobia, no seizures, no sore throat, no swollen glands, no syncope, no tingling, no URI, no visual change and no weakness     Past Medical History  Diagnosis Date  . Back pain     Past Surgical History  Procedure Laterality Date  . No past surgeries      Family History  Problem Relation Age of Onset  . Hypertension Mother   . COPD Mother   . Hypertension Maternal Grandmother   . Cancer Maternal Grandmother     History  Substance Use Topics  . Smoking status: Current Every Day Smoker -- 0.50 packs/day    Types: Cigarettes  . Smokeless tobacco: Never Used     Comment: 1.5 ppd prior to pregnancy  . Alcohol Use: No    OB History   Grav Para Term Preterm Abortions TAB SAB Ect Mult Living   5 4 3 1 1 1    3       Review of Systems  Constitutional: Negative for fever and fatigue.   HENT: Negative for ear pain, congestion, sore throat, neck pain, neck stiffness and postnasal drip.   Eyes: Negative for blurred vision, photophobia and pain.  Respiratory: Negative for cough.   Cardiovascular: Negative for syncope and near-syncope.  Gastrointestinal: Positive for nausea and vomiting. Negative for abdominal pain and diarrhea.  Musculoskeletal: Negative for back pain.  Neurological: Positive for headaches. Negative for dizziness, focal weakness, seizures, speech difficulty, weakness, numbness and paresthesias.  All other systems reviewed and are negative.    Allergies  Review of patient's allergies indicates no known allergies.  Home Medications   Current Outpatient Rx  Name  Route  Sig  Dispense  Refill  . acetaminophen (TYLENOL) 325 MG tablet   Oral   Take 650 mg by mouth every 6 (six) hours as needed.           BP 120/76  Pulse 96  Temp(Src) 98.5 F (36.9 C) (Oral)  Ht 5' 5.5" (1.664 m)  Wt 220 lb (99.791 kg)  BMI 36.04 kg/m2  SpO2 100%  LMP 11/10/2012  Physical Exam  Constitutional: She is oriented to person, place, and time. She appears well-developed and well-nourished. No distress.  HENT:  Head: Normocephalic and atraumatic.  Right Ear: External ear normal.  Left Ear: External ear normal.  Mouth/Throat: Oropharynx is clear and moist.  No temporal tenderness  Eyes: Conjunctivae and EOM are normal. Pupils are equal,  round, and reactive to light.  Neck: Normal range of motion. Neck supple.  Cardiovascular: Normal rate, regular rhythm and intact distal pulses.   Pulmonary/Chest: Effort normal and breath sounds normal. She has no wheezes. She has no rales.  Abdominal: Soft. Bowel sounds are normal. There is no tenderness. There is no rebound and no guarding.  Musculoskeletal: Normal range of motion.  Lymphadenopathy:    She has no cervical adenopathy.  Neurological: She is alert and oriented to person, place, and time. She has normal reflexes.  No cranial nerve deficit.  Skin: Skin is warm and dry.  Psychiatric: She has a normal mood and affect.    ED Course  Procedures (including critical care time)  Labs Reviewed  URINALYSIS, ROUTINE W REFLEX MICROSCOPIC - Abnormal; Notable for the following:    Color, Urine AMBER (*)    APPearance CLOUDY (*)    Specific Gravity, Urine 1.031 (*)    Bilirubin Urine SMALL (*)    All other components within normal limits  PREGNANCY, URINE   Ct Head Wo Contrast  01/26/2013   *RADIOLOGY REPORT*  Clinical Data: Headache and vomiting; chills and generalized body aches.  CT HEAD WITHOUT CONTRAST  Technique:  Contiguous axial images were obtained from the base of the skull through the vertex without contrast.  Comparison: None.  Findings: There is no evidence of acute infarction, mass lesion, or intra- or extra-axial hemorrhage on CT.  The posterior fossa, including the cerebellum, brainstem and fourth ventricle, is within normal limits.  The third and lateral ventricles, and basal ganglia are unremarkable in appearance.  The cerebral hemispheres are symmetric in appearance, with normal gray- white differentiation.  No mass effect or midline shift is seen.  There is no evidence of fracture; visualized osseous structures are unremarkable in appearance.  The visualized portions of the orbits are within normal limits.  The paranasal sinuses and mastoid air cells are well-aerated.  No significant soft tissue abnormalities are seen.  IMPRESSION: Unremarkable noncontrast CT of the head.   Original Report Authenticated By: Tonia Ghent, M.D.     No diagnosis found.    MDM  Exam and vitals benign.  No indication for LP at this time.  Patient resting comfortably in room on reexam.  Feels markedly improved.  Return for fevers stiff neck focal neurologic deficits changes in vision or speech or thinking.          Jasmine Awe, MD 01/26/13 [phone]

## 2013-01-26 NOTE — ED Notes (Signed)
Patient complains of general bodyaches, headache, and vomiting x 2 days. Taking tylenol without relief. Also took pepto bismol w/o relief. Developed chills today

## 2013-01-26 NOTE — ED Notes (Signed)
Returned from CT.

## 2013-01-26 NOTE — ED Notes (Signed)
Patient transported to CT 

## 2014-07-05 ENCOUNTER — Encounter (HOSPITAL_BASED_OUTPATIENT_CLINIC_OR_DEPARTMENT_OTHER): Payer: Self-pay

## 2014-08-29 ENCOUNTER — Encounter (HOSPITAL_BASED_OUTPATIENT_CLINIC_OR_DEPARTMENT_OTHER): Payer: Self-pay | Admitting: *Deleted

## 2014-08-29 ENCOUNTER — Emergency Department (HOSPITAL_BASED_OUTPATIENT_CLINIC_OR_DEPARTMENT_OTHER)
Admission: EM | Admit: 2014-08-29 | Discharge: 2014-08-29 | Disposition: A | Discharge status: Home / Self Care | Payer: Medicaid Other | Attending: Emergency Medicine | Admitting: Emergency Medicine

## 2014-08-29 ENCOUNTER — Emergency Department (HOSPITAL_BASED_OUTPATIENT_CLINIC_OR_DEPARTMENT_OTHER): Payer: Medicaid Other

## 2014-08-29 DIAGNOSIS — Z791 Long term (current) use of non-steroidal anti-inflammatories (NSAID): Secondary | ICD-10-CM | POA: Insufficient documentation

## 2014-08-29 DIAGNOSIS — J209 Acute bronchitis, unspecified: Secondary | ICD-10-CM

## 2014-08-29 DIAGNOSIS — R059 Cough, unspecified: Secondary | ICD-10-CM

## 2014-08-29 DIAGNOSIS — R05 Cough: Secondary | ICD-10-CM

## 2014-08-29 DIAGNOSIS — Z720 Tobacco use: Secondary | ICD-10-CM | POA: Insufficient documentation

## 2014-08-29 MED ORDER — ALBUTEROL SULFATE HFA 108 (90 BASE) MCG/ACT IN AERS: 2.0000 | INHALATION_SPRAY | RESPIRATORY_TRACT | Status: DC | PRN

## 2014-08-29 MED ORDER — AMOXICILLIN 500 MG PO CAPS: 500.0000 mg | ORAL_CAPSULE | Freq: Three times a day (TID) | ORAL | Status: DC

## 2014-08-29 MED ORDER — HYDROCODONE-ACETAMINOPHEN 5-325 MG PO TABS: 1.0000 | ORAL_TABLET | ORAL | Status: DC | PRN

## 2014-08-29 MED ORDER — PREDNISONE 20 MG PO TABS: 40.0000 mg | ORAL_TABLET | Freq: Every day | ORAL | Status: DC

## 2014-08-29 NOTE — Discharge Instructions (Signed)

## 2014-08-29 NOTE — ED Notes (Signed)
Patient states she's had a cold for 3-4 weeks, cough has been worsening. Now SOB, and the pain radiates to her back when she coughs

## 2014-08-29 NOTE — ED Provider Notes (Signed)
CSN: [id number]     Arrival date & time 08/29/14  1227 History   First MD Initiated Contact with Patient 08/29/14 1337     Chief Complaint  Patient presents with  . URI     (Consider location/radiation/quality/duration/timing/severity/associated sxs/prior Treatment) HPI Comments: Patient reports that she has been sick for approximately 4 weeks. She has been experiencing nasal congestion, cough and chest congestion. Her cough has worsened and now she is feeling like she might have pulled a muscle. She has sharp pains in her ribs when she coughs and feels short of breath like she is wheezing. She does not have a history of asthma. She has not had a fever.  Patient is a 33 y.o. female presenting with URI.  URI Presenting symptoms: congestion and cough   Associated symptoms: wheezing     Past Medical History  Diagnosis Date  . Back pain    Past Surgical History  Procedure Laterality Date  . No past surgeries     Family History  Problem Relation Age of Onset  . Hypertension Mother   . COPD Mother   . Hypertension Maternal Grandmother   . Cancer Maternal Grandmother    History  Substance Use Topics  . Smoking status: Current Every Day Smoker -- 0.50 packs/day    Types: Cigarettes  . Smokeless tobacco: Never Used     Comment: 1.5 ppd prior to pregnancy  . Alcohol Use: No   OB History    Gravida Para Term Preterm AB TAB SAB Ectopic Multiple Living   5 4 3 1 1 1    3      Review of Systems  HENT: Positive for congestion.   Respiratory: Positive for cough and wheezing.   All other systems reviewed and are negative.     Allergies  Review of patient's allergies indicates no known allergies.  Home Medications   Prior to Admission medications   Medication Sig Start Date End Date Taking? Authorizing Provider  acetaminophen (TYLENOL) 325 MG tablet Take 650 mg by mouth every 6 (six) hours as needed.    Historical Provider, MD  naproxen (NAPROSYN) 375 MG tablet Take 1  tablet (375 mg total) by mouth 2 (two) times daily with a meal. 01/26/13   April K Palumbo-Rasch, MD  promethazine (PHENERGAN) 25 MG suppository Place 1 suppository (25 mg total) rectally every 6 (six) hours as needed for nausea. 01/26/13   April K Palumbo-Rasch, MD   BP 112/63 mmHg  Pulse 70  Temp(Src) 98.9 F (37.2 C) (Oral)  Resp 16  Ht 5' 5.5" (1.664 m)  Wt 210 lb (95.255 kg)  BMI 34.40 kg/m2  SpO2 100% Physical Exam  Constitutional: She is oriented to person, place, and time. She appears well-developed and well-nourished. No distress.  HENT:  Head: Normocephalic and atraumatic.  Right Ear: Hearing normal.  Left Ear: Hearing normal.  Nose: Nose normal.  Mouth/Throat: Oropharynx is clear and moist and mucous membranes are normal.  Eyes: Conjunctivae and EOM are normal. Pupils are equal, round, and reactive to light.  Neck: Normal range of motion. Neck supple.  Cardiovascular: Regular rhythm, S1 normal and S2 normal.  Exam reveals no gallop and no friction rub.   No murmur heard. Pulmonary/Chest: Effort normal. No respiratory distress. She has wheezes ( slight at bases). She exhibits no tenderness.  Abdominal: Soft. Normal appearance and bowel sounds are normal. There is no hepatosplenomegaly. There is no tenderness. There is no rebound, no guarding, no tenderness at McBurney's point and  negative Murphy's sign. No hernia.  Musculoskeletal: Normal range of motion.  Neurological: She is alert and oriented to person, place, and time. She has normal strength. No cranial nerve deficit or sensory deficit. Coordination normal. GCS eye subscore is 4. GCS verbal subscore is 5. GCS motor subscore is 6.  Skin: Skin is warm, dry and intact. No rash noted. No cyanosis.  Psychiatric: She has a normal mood and affect. Her speech is normal and behavior is normal. Thought content normal.  Nursing note and vitals reviewed.   ED Course  Procedures (including critical care time) Labs Review Labs  Reviewed - No data to display  Imaging Review Dg Chest 2 View  08/29/2014   CLINICAL DATA:  Shortness of breath with productive cough, nausea, fever, body aches, and pain.  EXAM: CHEST  2 VIEW  COMPARISON:  09/16/2007.  FINDINGS: The heart size and mediastinal contours are within normal limits. Both lungs are clear. The visualized skeletal structures are unremarkable. Stable chest from priors.  IMPRESSION: No active cardiopulmonary disease.   Electronically Signed   By: Davonna BellingJohn  Curnes M.D.   On: 08/29/2014 14:05     EKG Interpretation None      MDM   Final diagnoses:  Cough  Bronchitis  Persistent cough for 4 weeks with mild bronchospasm. Chest x-ray clear, no evidence of pneumonia. Vital signs are stable.    Gilda Creasehristopher J. Pollina, MD 08/29/14 [phone]

## 2014-09-19 ENCOUNTER — Encounter (HOSPITAL_BASED_OUTPATIENT_CLINIC_OR_DEPARTMENT_OTHER): Payer: Self-pay

## 2014-09-19 ENCOUNTER — Emergency Department (HOSPITAL_BASED_OUTPATIENT_CLINIC_OR_DEPARTMENT_OTHER)
Admission: EM | Admit: 2014-09-19 | Discharge: 2014-09-19 | Disposition: A | Discharge status: Home / Self Care | Payer: Medicaid Other | Attending: Emergency Medicine | Admitting: Emergency Medicine

## 2014-09-19 DIAGNOSIS — Z79899 Other long term (current) drug therapy: Secondary | ICD-10-CM | POA: Insufficient documentation

## 2014-09-19 DIAGNOSIS — R0981 Nasal congestion: Secondary | ICD-10-CM | POA: Insufficient documentation

## 2014-09-19 DIAGNOSIS — Z792 Long term (current) use of antibiotics: Secondary | ICD-10-CM | POA: Insufficient documentation

## 2014-09-19 DIAGNOSIS — M26629 Arthralgia of temporomandibular joint, unspecified side: Secondary | ICD-10-CM

## 2014-09-19 DIAGNOSIS — R05 Cough: Secondary | ICD-10-CM | POA: Insufficient documentation

## 2014-09-19 DIAGNOSIS — M2662 Arthralgia of temporomandibular joint: Secondary | ICD-10-CM | POA: Insufficient documentation

## 2014-09-19 DIAGNOSIS — Z720 Tobacco use: Secondary | ICD-10-CM | POA: Insufficient documentation

## 2014-09-19 DIAGNOSIS — Z7952 Long term (current) use of systemic steroids: Secondary | ICD-10-CM | POA: Insufficient documentation

## 2014-09-19 MED ORDER — DIAZEPAM 5 MG PO TABS
5.0000 mg | ORAL_TABLET | Freq: Once | ORAL | Status: AC
  Administered 2014-09-19: 5 mg via ORAL
  Filled 2014-09-19: qty 1

## 2014-09-19 MED ORDER — HYDROCODONE-ACETAMINOPHEN 5-325 MG PO TABS: 1.0000 | ORAL_TABLET | Freq: Four times a day (QID) | ORAL | Status: DC | PRN

## 2014-09-19 MED ORDER — HYDROCODONE-ACETAMINOPHEN 5-325 MG PO TABS
1.0000 | ORAL_TABLET | Freq: Once | ORAL | Status: AC
  Administered 2014-09-19: 1 via ORAL
  Filled 2014-09-19: qty 1

## 2014-09-19 MED ORDER — DIAZEPAM 5 MG PO TABS: 5.0000 mg | ORAL_TABLET | Freq: Three times a day (TID) | ORAL | Status: DC | PRN

## 2014-09-19 NOTE — ED Notes (Signed)
In room to discharge patient, pt expressing concern that she could not afford medications tonight - verbal orders given per EDP [place].

## 2014-09-19 NOTE — ED Provider Notes (Signed)
CSN: [id number]     Arrival date & time 09/19/14  0010 History   First MD Initiated Contact with Patient 09/19/14 0056     Chief Complaint  Patient presents with  . Ear Pain      (Consider location/radiation/quality/duration/timing/severity/associated sxs/prior Treatment) HPI  This is a 34 year old female who had a cold 2 weeks ago. She states her cold symptoms persist. Specifically she is having nasal congestion and cough. She denies fevers. She denies wheezing or shortness of breath. She is here with left ear pain for the past 3 days. The pain is located anterior to the left ear and is worse with palpation or movement of the jaw. She also feels like her left ear is "stuffy". Pain is moderate. She has not had a similar pain in the past.  Past Medical History  Diagnosis Date  . Back pain    Past Surgical History  Procedure Laterality Date  . No past surgeries     Family History  Problem Relation Age of Onset  . Hypertension Mother   . COPD Mother   . Hypertension Maternal Grandmother   . Cancer Maternal Grandmother    History  Substance Use Topics  . Smoking status: Current Every Day Smoker -- 0.50 packs/day    Types: Cigarettes  . Smokeless tobacco: Never Used     Comment: 1.5 ppd prior to pregnancy  . Alcohol Use: No   OB History    Gravida Para Term Preterm AB TAB SAB Ectopic Multiple Living   5 4 3 1 1 1    3      Review of Systems  All other systems reviewed and are negative.   Allergies  Review of patient's allergies indicates no known allergies.  Home Medications   Prior to Admission medications   Medication Sig Start Date End Date Taking? Authorizing Provider  albuterol (PROVENTIL HFA;VENTOLIN HFA) 108 (90 BASE) MCG/ACT inhaler Inhale 2 puffs into the lungs every 4 (four) hours as needed for wheezing or shortness of breath. 08/29/14  Yes Gilda Creasehristopher J. Pollina, MD  amoxicillin (AMOXIL) 500 MG capsule Take 1 capsule (500 mg total) by mouth 3 (three) times  daily. 08/29/14   Gilda Creasehristopher J. Pollina, MD  HYDROcodone-acetaminophen (NORCO/VICODIN) 5-325 MG per tablet Take 1 tablet by mouth every 4 (four) hours as needed for moderate pain. 08/29/14   Gilda Creasehristopher J. Pollina, MD  predniSONE (DELTASONE) 20 MG tablet Take 2 tablets (40 mg total) by mouth daily with breakfast. 08/29/14   Gilda Creasehristopher J. Pollina, MD   BP 107/52 mmHg  Pulse 80  Temp(Src) 98.3 F (36.8 C) (Oral)  Ht 5\' 5"  (1.651 m)  Wt 210 lb (95.255 kg)  BMI 34.95 kg/m2  SpO2 100%  LMP 09/19/2014   Physical Exam  General: Well-developed, well-nourished female in no acute distress; appearance consistent with age of record HENT: normocephalic; atraumatic; mild nasal congestion; TMs pearly gray with light reflex bilaterally, no erythema or fluid seen; left TMJ tenderness with pain on range of motion Eyes: pupils equal, round and reactive to light; extraocular muscles intact Neck: supple; no lymphadenopathy Heart: regular rate and rhythm Lungs: clear to auscultation bilaterally Abdomen: soft; nondistended; nontender; bowel sounds present Extremities: No deformity; full range of motion; pulses normal Neurologic: Awake, alert and oriented; motor function intact in all extremities and symmetric; no facial droop Skin: Warm and dry Psychiatric: Normal mood and affect    ED Course  Procedures (including critical care time)   MDM  Exam consistent with TMJ. Although  she has subjective hearing changes in the left ear there is no abnormality on exam. We will treat for TMJ and refer to dentistry. She was advised to return should her hearing change.  Hanley Seamen, MD 09/19/14 [phone]

## 2014-09-19 NOTE — ED Notes (Signed)
MD at bedside. 

## 2014-09-19 NOTE — ED Notes (Signed)
Pt reports left ear pain, decreased hearing, ear feels "stuffy" for 3 days - states she has had recent URI.

## 2014-11-27 IMAGING — CT CT HEAD W/O CM
1 series · 16 of 30 positions shown, 20 images · non-contrast
Comparison: None.

CLINICAL DATA: Headache and vomiting; chills and generalized body
aches.

CT HEAD WITHOUT CONTRAST
TECHNIQUE: Contiguous axial images were obtained from the base of
the skull through the vertex without contrast.

[Series 2: head 4.8 h37s · axial · 0.44mm/px · z∈[-119,+14]mm · 16 of 32 slices shown, 20 images]
[im 2/32  brain]
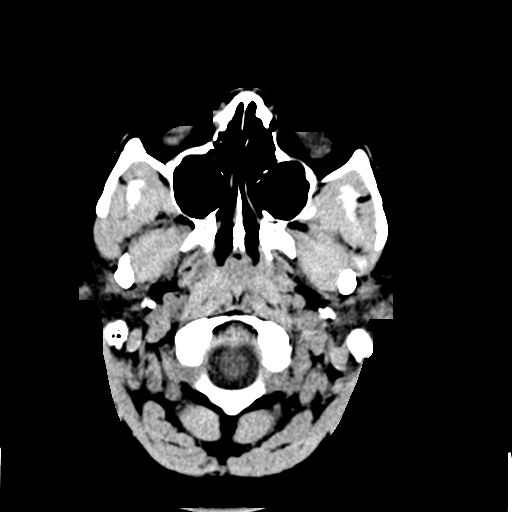
[im 2/32  bone]
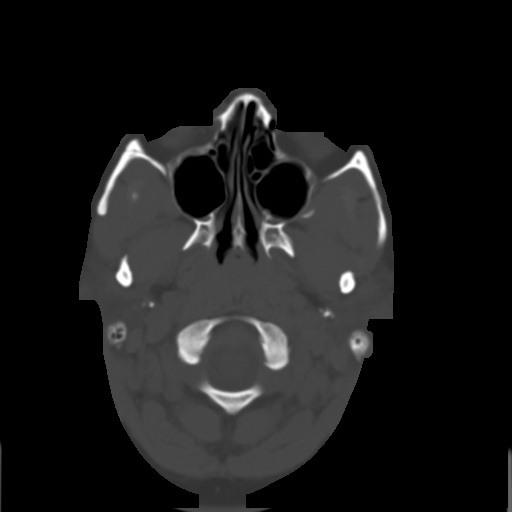
[im 4/32  brain]
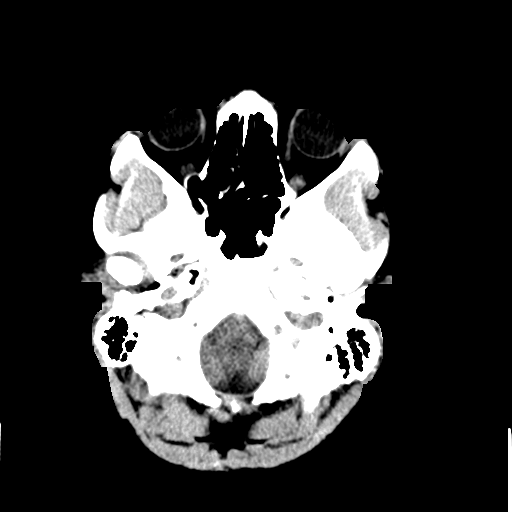
[im 6/32  brain]
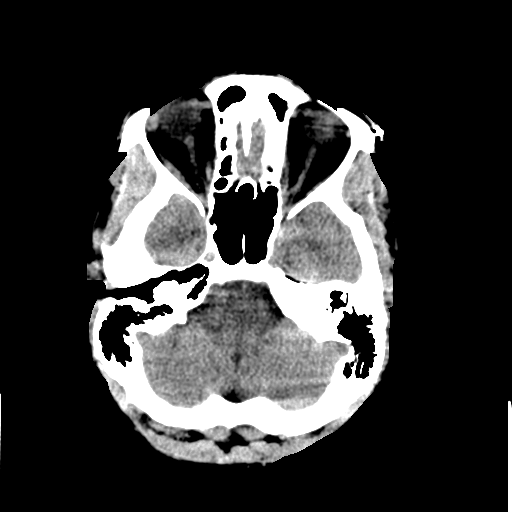
[im 8/32  brain]
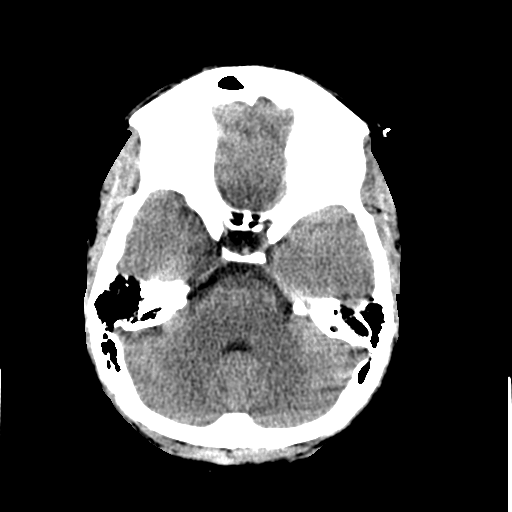
[im 9/32  brain]
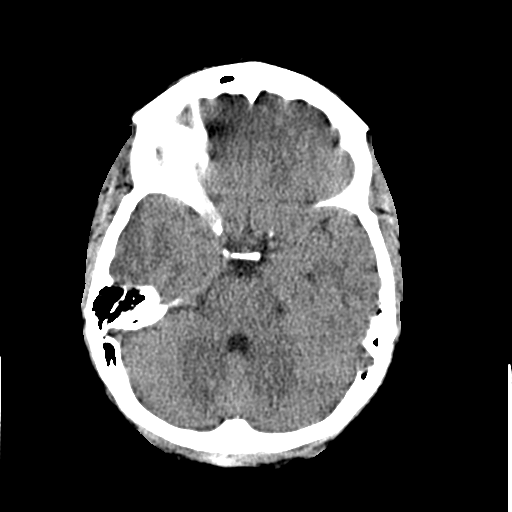
[im 9/32  bone]
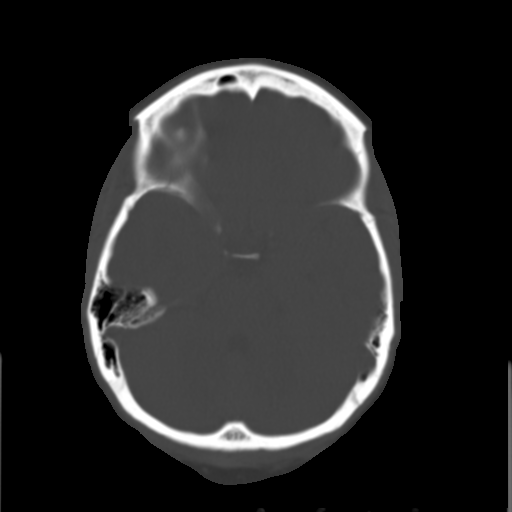
[im 11/32  brain]
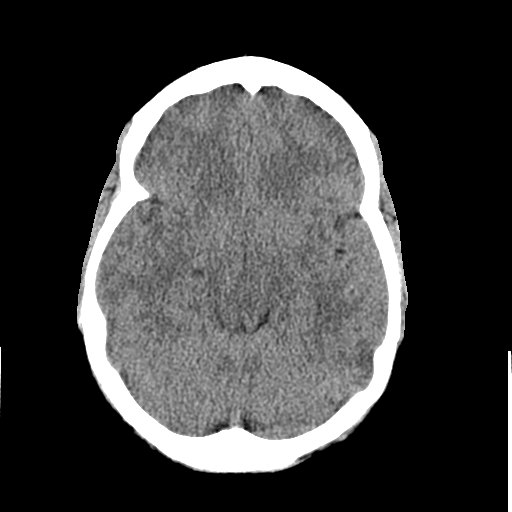
[im 13/32  brain]
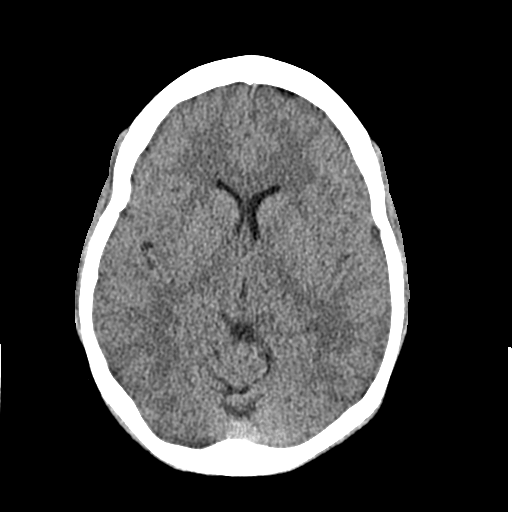
[im 15/32  brain]
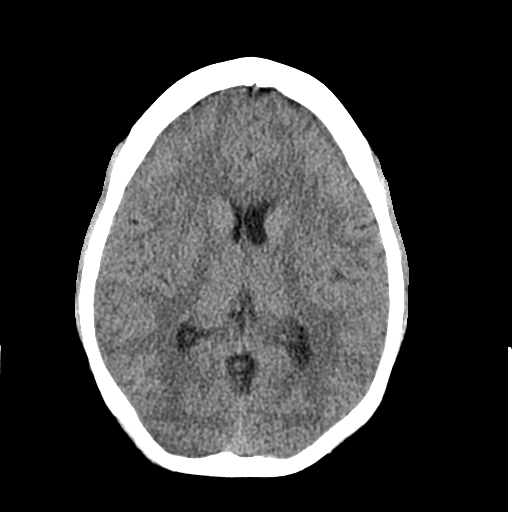
[im 17/32  brain]
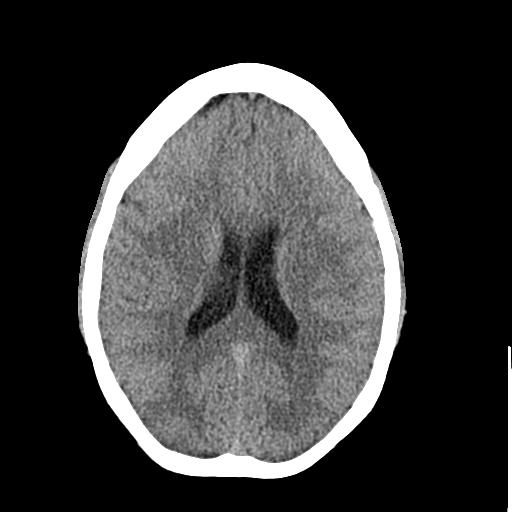
[im 17/32  bone]
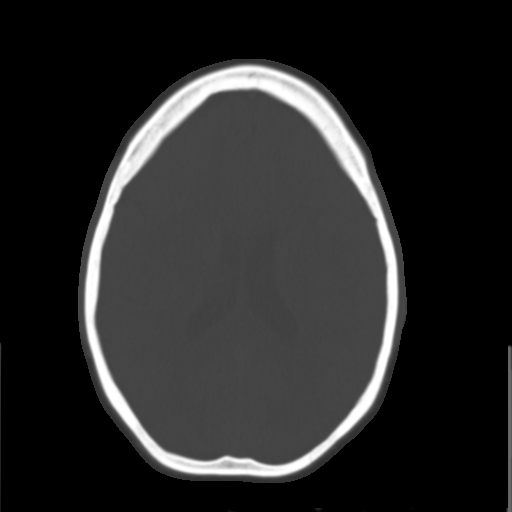
[im 19/32  brain]
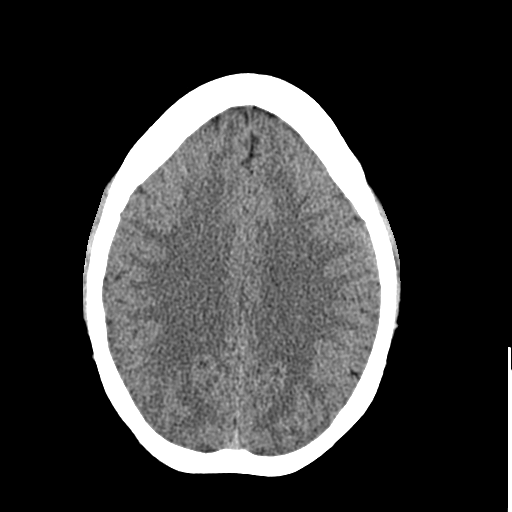
[im 21/32  brain]
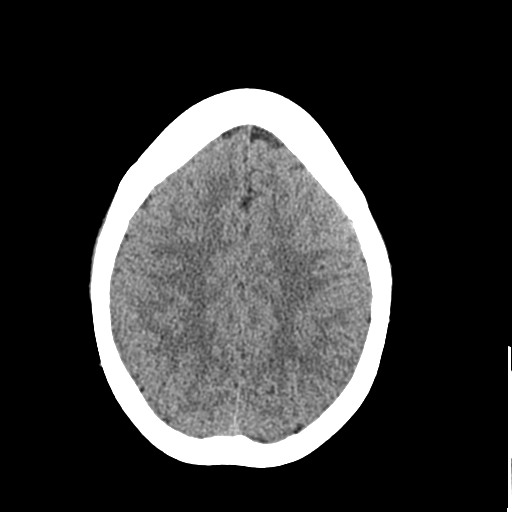
[im 23/32  brain]
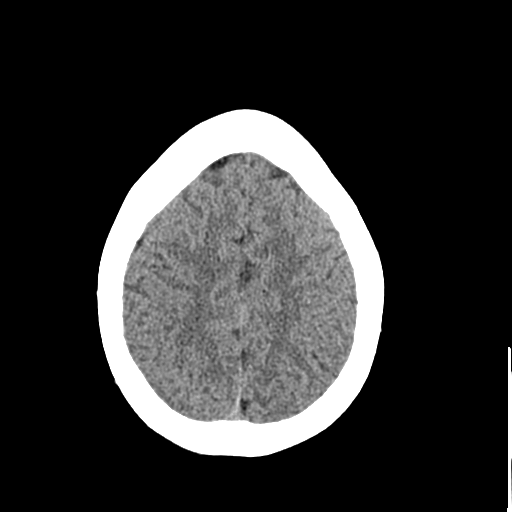
[im 24/32  brain]
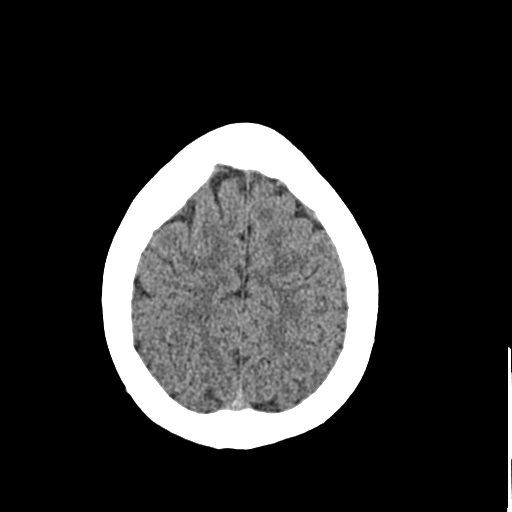
[im 24/32  bone]
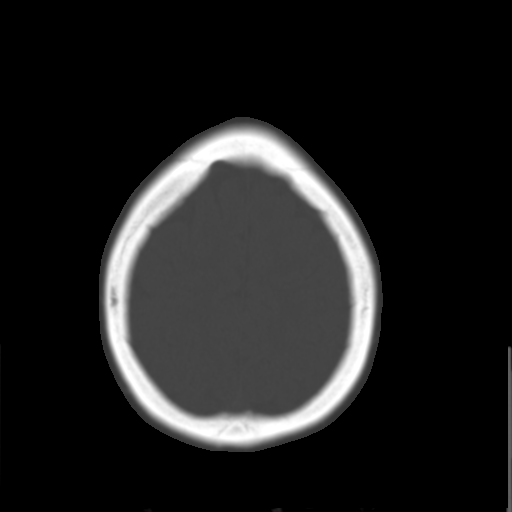
[im 26/32  brain]
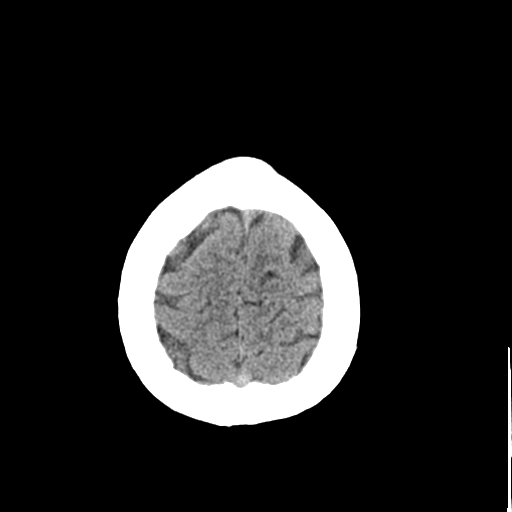
[im 28/32  brain]
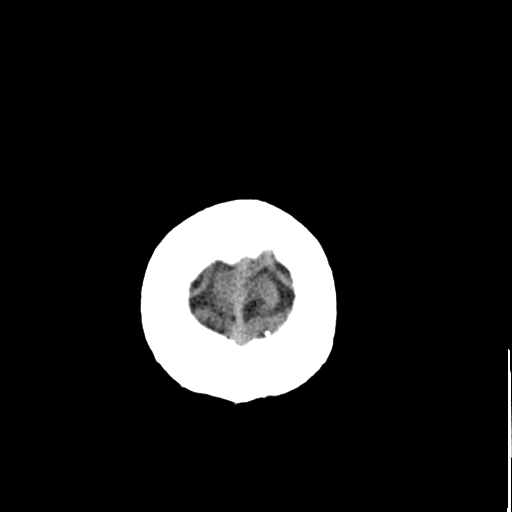
[im 30/32  brain]
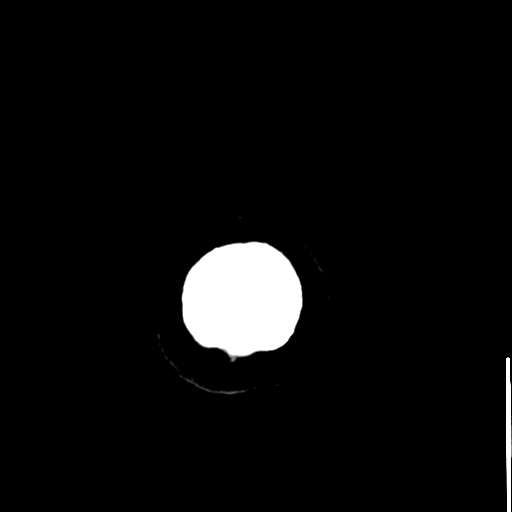

[16 of 30 positions shown; findings below may reference images not displayed]

FINDINGS: There is no evidence of acute infarction, mass lesion, or
intra- or extra-axial hemorrhage on CT.

The posterior fossa, including the cerebellum, brainstem and fourth
ventricle, is within normal limits.  The third and lateral
ventricles, and basal ganglia are unremarkable in appearance.  The
cerebral hemispheres are symmetric in appearance, with normal gray-
white differentiation.  No mass effect or midline shift is seen.

There is no evidence of fracture; visualized osseous structures are
unremarkable in appearance.  The visualized portions of the orbits
are within normal limits.  The paranasal sinuses and mastoid air
cells are well-aerated.  No significant soft tissue abnormalities
are seen.
IMPRESSION: Unremarkable noncontrast CT of the head.

## 2015-02-22 ENCOUNTER — Other Ambulatory Visit: Payer: Self-pay | Admitting: Obstetrics and Gynecology

## 2015-02-22 DIAGNOSIS — R922 Inconclusive mammogram: Secondary | ICD-10-CM

## 2015-02-25 ENCOUNTER — Other Ambulatory Visit: Payer: Medicaid Other

## 2015-02-28 ENCOUNTER — Ambulatory Visit
Admission: RE | Admit: 2015-02-28 | Discharge: 2015-02-28 | Disposition: A | Discharge status: Home / Self Care | Payer: BLUE CROSS/BLUE SHIELD | Source: Ambulatory Visit | Attending: Obstetrics and Gynecology | Admitting: Obstetrics and Gynecology

## 2015-02-28 DIAGNOSIS — R922 Inconclusive mammogram: Secondary | ICD-10-CM

## 2016-06-29 IMAGING — CR DG CHEST 2V
2 series · 2 of 2 positions shown · non-contrast
Comparison: 09/16/2007.

CLINICAL DATA: Shortness of breath with productive cough, nausea,
fever, body aches, and pain.

EXAM:
CHEST  2 VIEW

[w chest pa]
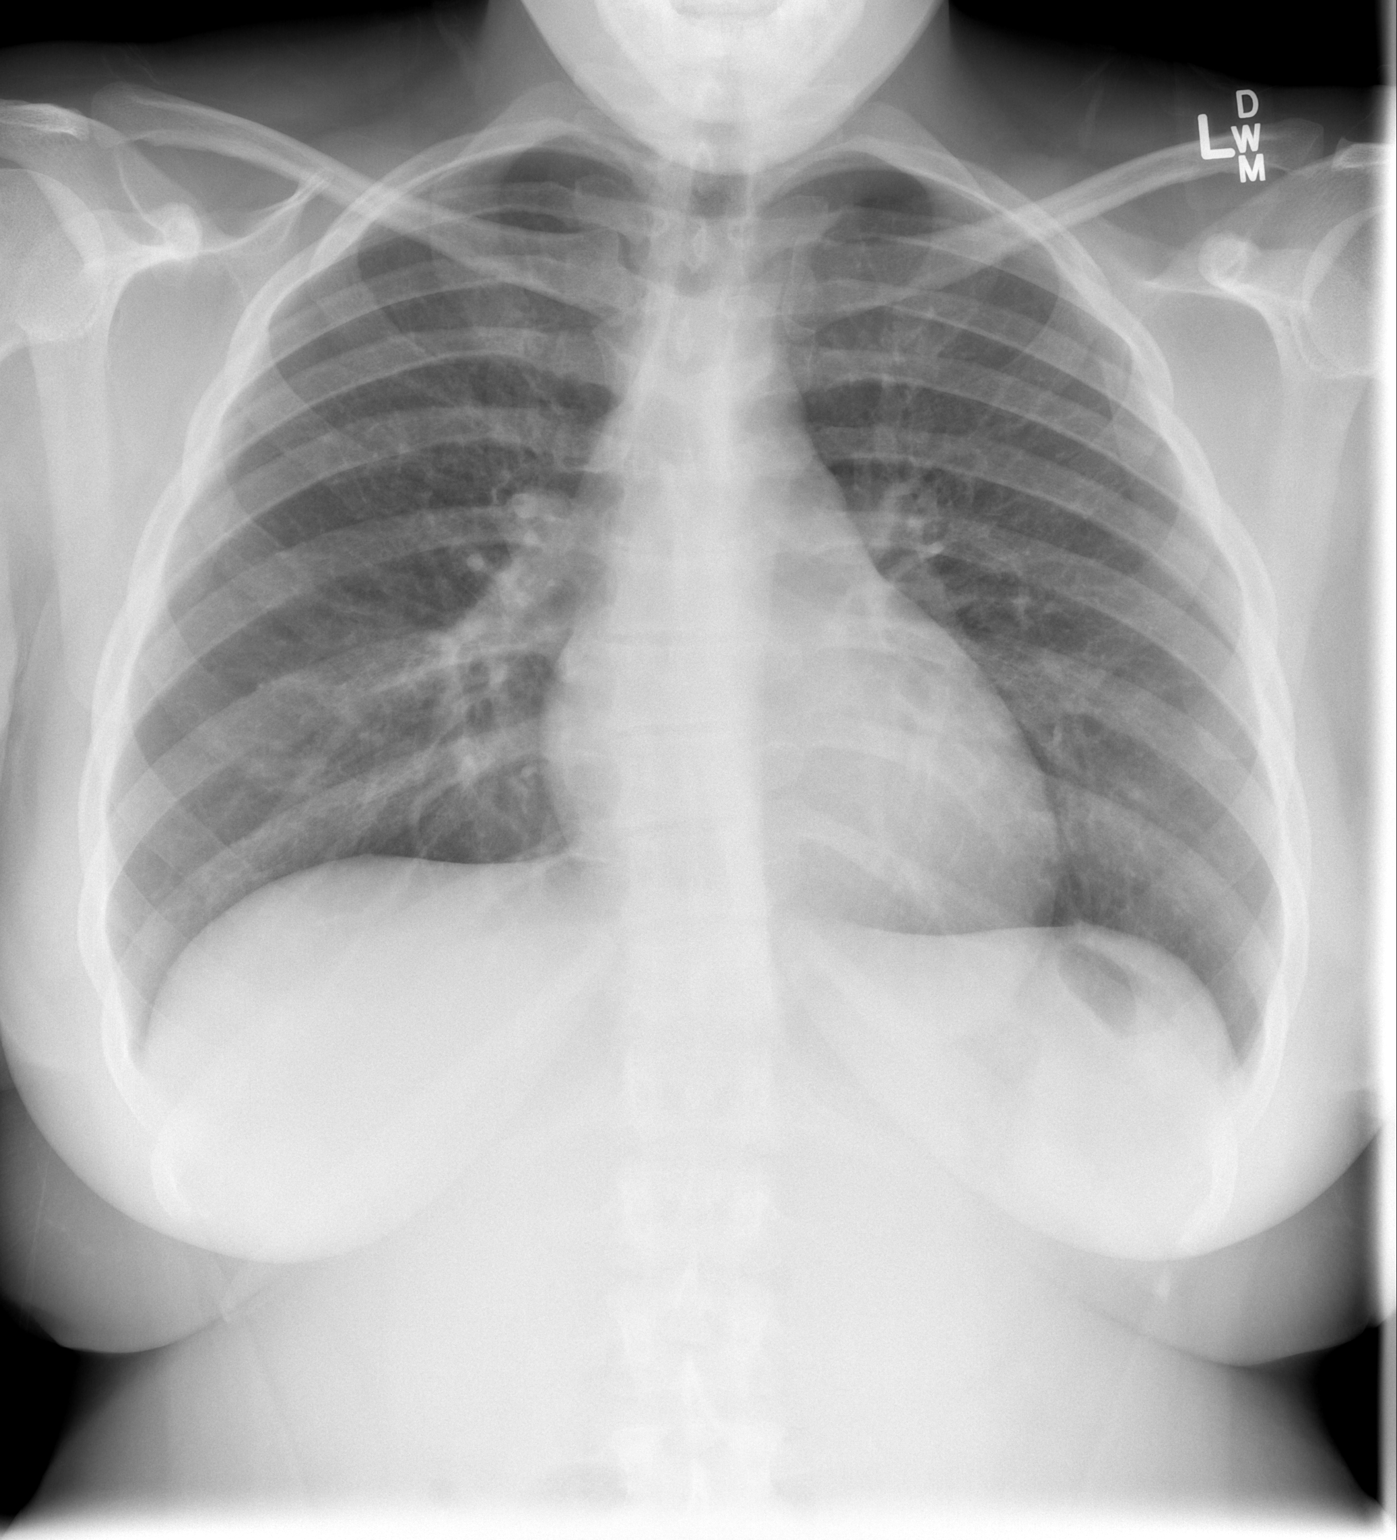

[w chest lat]
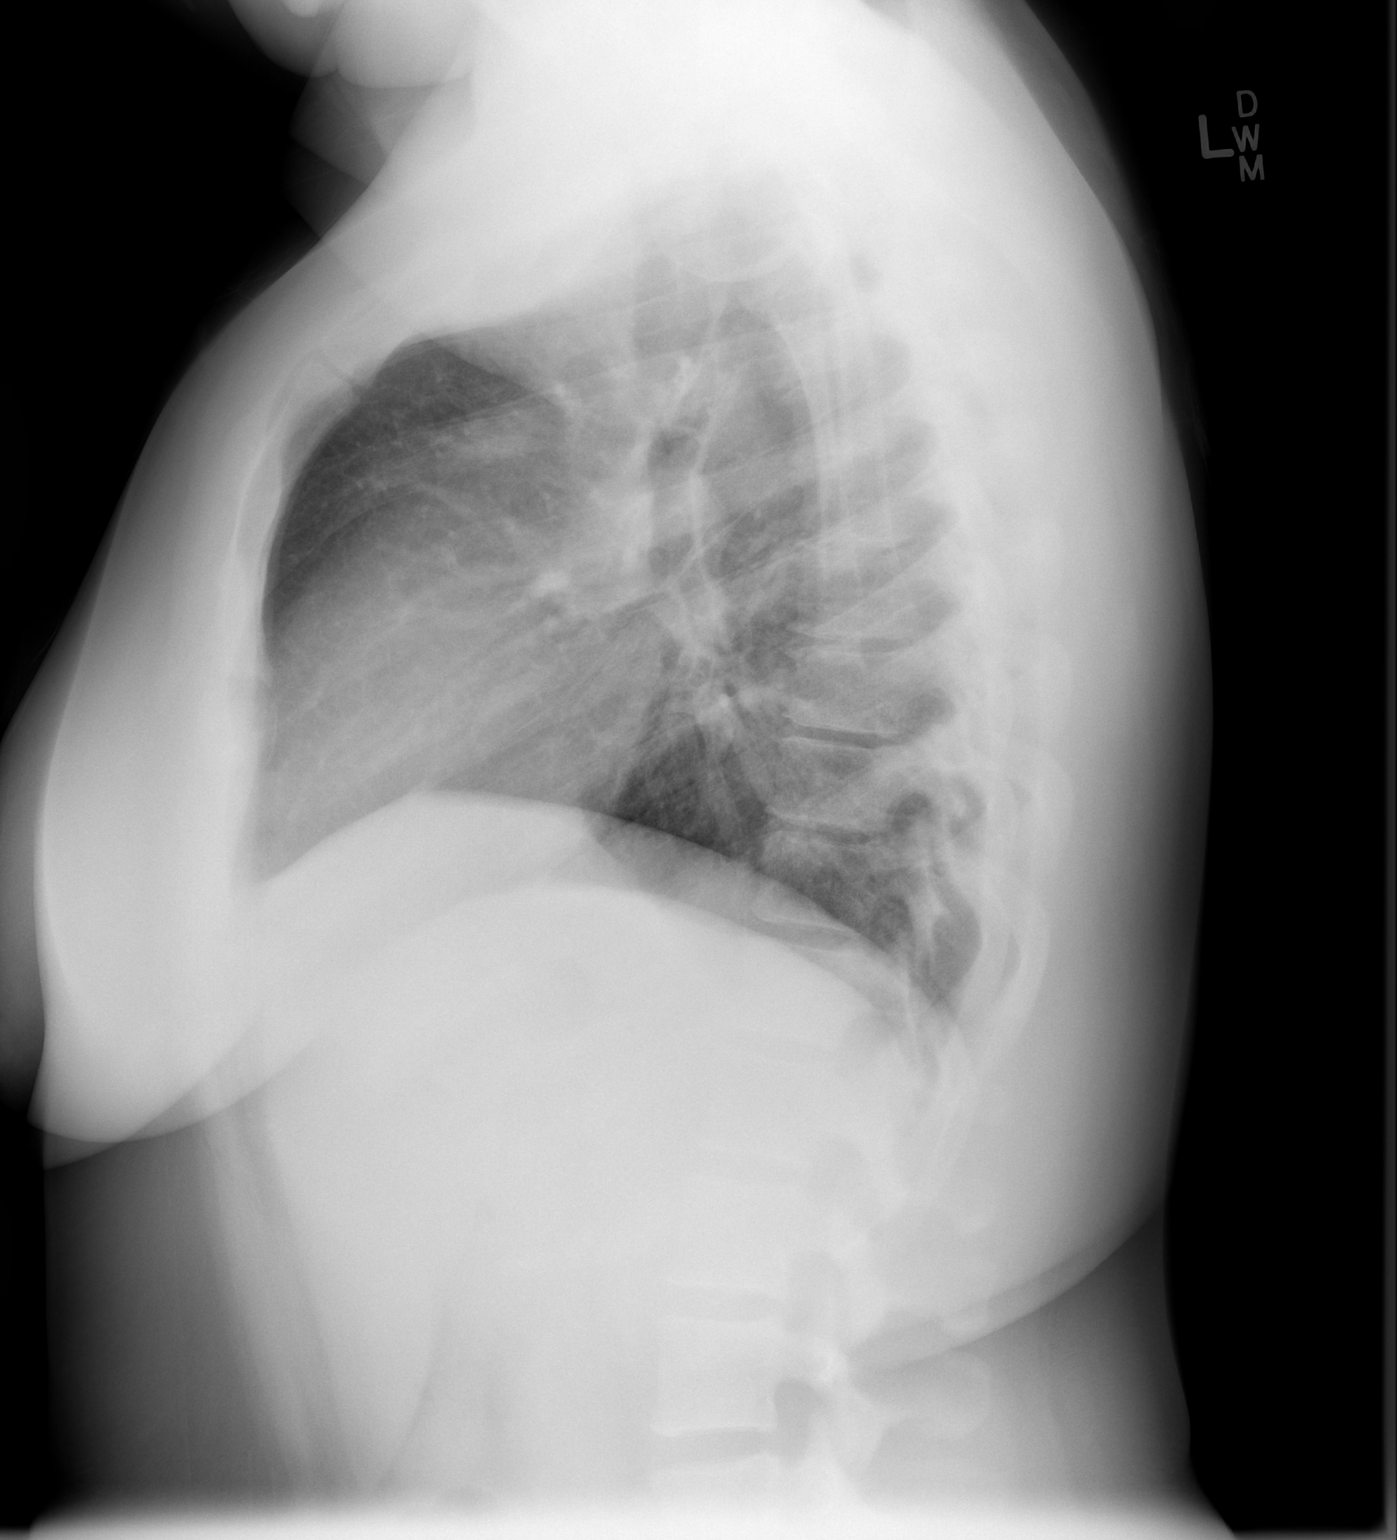

[2 of 2 positions shown; findings below may reference images not displayed]

FINDINGS: The heart size and mediastinal contours are within normal limits.
Both lungs are clear. The visualized skeletal structures are
unremarkable. Stable chest from priors.
IMPRESSION: No active cardiopulmonary disease.

## 2023-08-30 ENCOUNTER — Ambulatory Visit
Admission: EM | Admit: 2023-08-30 | Discharge: 2023-08-30 | Disposition: A | Discharge status: Home / Self Care | Payer: 59 | Attending: Family Medicine | Admitting: Family Medicine

## 2023-08-30 DIAGNOSIS — B9689 Other specified bacterial agents as the cause of diseases classified elsewhere: Secondary | ICD-10-CM | POA: Insufficient documentation

## 2023-08-30 DIAGNOSIS — N760 Acute vaginitis: Secondary | ICD-10-CM | POA: Insufficient documentation

## 2023-08-30 DIAGNOSIS — B3731 Acute candidiasis of vulva and vagina: Secondary | ICD-10-CM | POA: Diagnosis not present

## 2023-08-30 DIAGNOSIS — N898 Other specified noninflammatory disorders of vagina: Secondary | ICD-10-CM

## 2023-08-30 NOTE — ED Triage Notes (Signed)
Pt here today for testing. Says female friend recently seen for bacterial infection. Wanted to get tested. No current sxs.

## 2023-08-30 NOTE — Discharge Instructions (Addendum)
Advised patient we will follow-up with [facility] swab results once returned.

## 2023-08-30 NOTE — ED Provider Notes (Signed)
[facility]    CSN: [id number] Arrival date & time: 08/30/23  1015      History   Chief Complaint No chief complaint on file.   HPI Annette Stafford is a 42 y.o. female.   HPI 42 year old female presents with concern of possible bacterial infection of her vagina secondary to her friend having bacterial infection of his perineum.  PMH significant for obesity, current cigarette smoker, and history of HPV.  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 Authorizing Provider  promethazine (PHENERGAN) 25 MG suppository Place 1 suppository (25 mg total) rectally every 6 (six) hours as needed for nausea. 01/26/13 08/29/14  Palumbo, April, MD    Family History Family History  Problem Relation Age of Onset   Hypertension Mother    COPD Mother    Hypertension Maternal Grandmother    Cancer Maternal Grandmother     Social History Social History   Tobacco Use   Smoking status: Every Day    Current packs/day: 0.50    Types: Cigarettes   Smokeless tobacco: Never   Tobacco comments:    1.5 ppd prior to pregnancy  Substance Use Topics   Alcohol use: No   Drug use: No     Allergies   Lavender oil   Review of Systems Review of Systems  Genitourinary:  Positive for vaginal discharge.  All other systems reviewed and are  negative.    Physical Exam Triage Vital Signs ED Triage Vitals  Encounter Vitals Group     BP      Systolic BP Percentile      Diastolic BP Percentile      Pulse      Resp      Temp      Temp src      SpO2      Weight      Height      Head Circumference      Peak Flow      Pain Score      Pain Loc      Pain Education      Exclude from Growth Chart    No data found.  Updated Vital Signs BP 125/87 (BP Location: Right Arm)   Pulse 75   Temp 98.5 F (36.9 C) (Oral)   Resp 16   LMP 08/28/2023 (Exact Date)   SpO2 97%   Physical Exam Vitals and nursing note reviewed.  Constitutional:      Appearance: Normal appearance. She is obese. She is not ill-appearing.  HENT:     Head: Normocephalic and atraumatic.     Mouth/Throat:     Mouth: Mucous membranes are moist.     Pharynx: Oropharynx is clear.  Eyes:     Extraocular Movements: Extraocular movements intact.     Conjunctiva/sclera: Conjunctivae normal.     Pupils: Pupils are equal, round, and reactive to light.  Cardiovascular:     Rate and Rhythm: Normal rate and regular rhythm.     Pulses: Normal pulses.     Heart sounds: Normal heart sounds.  Pulmonary:     Effort: Pulmonary effort is normal.     Breath sounds: Normal breath sounds. No wheezing, rhonchi or rales.  Musculoskeletal:        General: Normal range of motion.     Cervical back: Normal range of motion and neck supple.  Skin:    General: Skin is warm and dry.  Neurological:     General: No focal deficit present.     Mental Status: She is alert and oriented to person, place, and time. Mental status is at baseline.  Psychiatric:        Mood and Affect: Mood normal.        Behavior: Behavior normal.        Thought Content: Thought content normal.      UC Treatments / Results  Labs (all labs ordered are listed, but only abnormal results are displayed) Labs Reviewed  CERVICOVAGINAL ANCILLARY ONLY    EKG   Radiology No results  found.  Procedures Procedures (including critical care time)  Medications Ordered in UC Medications - No data to display  Initial Impression / Assessment and Plan / UC Course  I have reviewed the triage vital signs and the nursing notes.  Pertinent labs & imaging results that were available during my care of the patient were reviewed by me and considered in my medical decision making (see chart for details).    MDM: 1.  Vaginal irritation-[facility] swab ordered. Advised patient we will follow-up with [facility] swab results once returned. Final Clinical Impressions(s) / UC Diagnoses   Final diagnoses:  Vaginal irritation     Discharge Instructions      Advised patient we will follow-up with [facility] swab results once returned.     ED Prescriptions   None    PDMP not reviewed this encounter.   Trevor Iha, FNP 08/30/23 1155

## 2023-09-02 ENCOUNTER — Telehealth (HOSPITAL_COMMUNITY): Payer: Self-pay

## 2023-09-02 LAB — CERVICOVAGINAL ANCILLARY ONLY
Bacterial Vaginitis (gardnerella): POSITIVE — AB
Candida Glabrata: NEGATIVE
Candida Vaginitis: POSITIVE — AB
Comment: NEGATIVE
Comment: NEGATIVE
Comment: NEGATIVE

## 2023-09-02 MED ORDER — FLUCONAZOLE 150 MG PO TABS: 150.0000 mg | ORAL_TABLET | Freq: Once | ORAL | 0 refills | Status: AC

## 2023-09-02 MED ORDER — METRONIDAZOLE 500 MG PO TABS: 500.0000 mg | ORAL_TABLET | Freq: Two times a day (BID) | ORAL | 0 refills | Status: AC

## 2023-09-02 NOTE — Telephone Encounter (Signed)
Per protocol, pt requires tx with metronidazole and Diflucan.  Attempted to reach patient x1. Unable to LVM.  Rx sent to pharmacy on file.
